# Patient Record
Sex: Female | Born: 1990
Health system: Southern US, Community
[De-identification: ages and names within clinical notes are randomized; demographics above are authoritative.]

## PROBLEM LIST (undated history)

## (undated) DIAGNOSIS — Z789 Other specified health status: Secondary | ICD-10-CM

---
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
## 2011-01-02 LAB — POCT URINALYSIS DIP (DEVICE)
Hgb urine dipstick: NEGATIVE
Hgb urine dipstick: NEGATIVE
Ketones, ur: NEGATIVE mg/dL
Nitrite: NEGATIVE
Nitrite: NEGATIVE
Protein, ur: 30 mg/dL — AB
Protein, ur: NEGATIVE mg/dL
Protein, ur: NEGATIVE mg/dL
Specific Gravity, Urine: 1.015 (ref 1.005–1.030)
Specific Gravity, Urine: 1.015 (ref 1.005–1.030)
Urobilinogen, UA: 0.2 mg/dL (ref 0.0–1.0)
Urobilinogen, UA: 1 mg/dL (ref 0.0–1.0)
Urobilinogen, UA: 0.2 mg/dL (ref 0.0–1.0)
pH: 6 (ref 5.0–8.0)
pH: 6.5 (ref 5.0–8.0)
pH: 6 (ref 5.0–8.0)

## 2011-01-03 LAB — CBC
Hemoglobin: 10.9 g/dL — ABNORMAL LOW (ref 12.0–15.0)
MCV: 90.5 fL (ref 78.0–100.0)
Platelets: 188 10*3/uL (ref 150–400)
Platelets: 230 10*3/uL (ref 150–400)
RDW: 13.6 % (ref 11.5–15.5)
RDW: 13.6 % (ref 11.5–15.5)
WBC: 8.9 10*3/uL (ref 4.0–10.5)

## 2011-01-03 LAB — RPR: RPR Ser Ql: NONREACTIVE

## 2011-05-27 ENCOUNTER — Emergency Department (HOSPITAL_COMMUNITY)
Admission: EM | Admit: 2011-05-27 | Discharge: 2011-05-28 | Disposition: A | Discharge status: Home / Self Care | Payer: Self-pay | Attending: Emergency Medicine | Admitting: Emergency Medicine

## 2011-05-27 DIAGNOSIS — R5381 Other malaise: Secondary | ICD-10-CM | POA: Insufficient documentation

## 2011-05-27 DIAGNOSIS — Z3201 Encounter for pregnancy test, result positive: Secondary | ICD-10-CM | POA: Insufficient documentation

## 2011-05-27 DIAGNOSIS — N390 Urinary tract infection, site not specified: Secondary | ICD-10-CM | POA: Insufficient documentation

## 2011-05-27 DIAGNOSIS — R112 Nausea with vomiting, unspecified: Secondary | ICD-10-CM | POA: Insufficient documentation

## 2011-05-27 DIAGNOSIS — O2390 Unspecified genitourinary tract infection in pregnancy, unspecified trimester: Secondary | ICD-10-CM | POA: Insufficient documentation

## 2011-05-27 LAB — URINALYSIS, ROUTINE W REFLEX MICROSCOPIC
Glucose, UA: NEGATIVE mg/dL
Nitrite: NEGATIVE
Protein, ur: NEGATIVE mg/dL
Urobilinogen, UA: 1 mg/dL (ref 0.0–1.0)

## 2011-05-27 LAB — URINE MICROSCOPIC-ADD ON

## 2011-05-27 LAB — POCT PREGNANCY, URINE: Preg Test, Ur: POSITIVE

## 2011-05-30 LAB — URINE CULTURE: Culture  Setup Time: 201209091125

## 2011-06-19 LAB — URINALYSIS, ROUTINE W REFLEX MICROSCOPIC
Bilirubin Urine: NEGATIVE
Hgb urine dipstick: NEGATIVE
Ketones, ur: 40 — AB
Nitrite: NEGATIVE
Urobilinogen, UA: 1

## 2011-06-19 LAB — URINE CULTURE: Colony Count: >=100000

## 2011-06-19 LAB — URINE MICROSCOPIC-ADD ON

## 2011-06-20 LAB — POCT URINALYSIS DIP (DEVICE)
Bilirubin Urine: NEGATIVE
Glucose, UA: NEGATIVE
Hgb urine dipstick: NEGATIVE
Hgb urine dipstick: NEGATIVE
Nitrite: NEGATIVE
Nitrite: NEGATIVE
Operator id: 297281
Protein, ur: NEGATIVE
Urobilinogen, UA: 0.2
Urobilinogen, UA: 0.2
pH: 7

## 2011-06-23 LAB — POCT URINALYSIS DIP (DEVICE)
Glucose, UA: NEGATIVE mg/dL
Ketones, ur: NEGATIVE mg/dL
Nitrite: NEGATIVE
Nitrite: POSITIVE — AB
Protein, ur: 30 mg/dL — AB
Protein, ur: 30 mg/dL — AB
Specific Gravity, Urine: 1.015 (ref 1.005–1.030)
Urobilinogen, UA: 0.2 mg/dL (ref 0.0–1.0)
Urobilinogen, UA: 1 mg/dL (ref 0.0–1.0)
pH: 7 (ref 5.0–8.0)

## 2011-07-18 ENCOUNTER — Encounter (HOSPITAL_COMMUNITY): Payer: Self-pay

## 2011-07-18 ENCOUNTER — Inpatient Hospital Stay (HOSPITAL_COMMUNITY)
Admission: AD | Admit: 2011-07-18 | Discharge: 2011-07-18 | Disposition: A | Discharge status: Home / Self Care | Payer: Self-pay | Source: Ambulatory Visit | Attending: Obstetrics & Gynecology | Admitting: Obstetrics & Gynecology

## 2011-07-18 DIAGNOSIS — O0930 Supervision of pregnancy with insufficient antenatal care, unspecified trimester: Secondary | ICD-10-CM

## 2011-07-18 DIAGNOSIS — A499 Bacterial infection, unspecified: Secondary | ICD-10-CM | POA: Insufficient documentation

## 2011-07-18 DIAGNOSIS — B9689 Other specified bacterial agents as the cause of diseases classified elsewhere: Secondary | ICD-10-CM | POA: Insufficient documentation

## 2011-07-18 DIAGNOSIS — J069 Acute upper respiratory infection, unspecified: Secondary | ICD-10-CM | POA: Insufficient documentation

## 2011-07-18 DIAGNOSIS — O2390 Unspecified genitourinary tract infection in pregnancy, unspecified trimester: Secondary | ICD-10-CM | POA: Insufficient documentation

## 2011-07-18 DIAGNOSIS — O219 Vomiting of pregnancy, unspecified: Secondary | ICD-10-CM

## 2011-07-18 DIAGNOSIS — R109 Unspecified abdominal pain: Secondary | ICD-10-CM | POA: Insufficient documentation

## 2011-07-18 DIAGNOSIS — N760 Acute vaginitis: Secondary | ICD-10-CM | POA: Insufficient documentation

## 2011-07-18 DIAGNOSIS — O99891 Other specified diseases and conditions complicating pregnancy: Secondary | ICD-10-CM | POA: Insufficient documentation

## 2011-07-18 HISTORY — DX: Other specified health status: Z78.9

## 2011-07-18 LAB — URINALYSIS, ROUTINE W REFLEX MICROSCOPIC
Glucose, UA: NEGATIVE mg/dL
Ketones, ur: 15 mg/dL — AB
Nitrite: NEGATIVE
Protein, ur: NEGATIVE mg/dL
Urobilinogen, UA: 0.2 mg/dL (ref 0.0–1.0)

## 2011-07-18 LAB — WET PREP, GENITAL: Yeast Wet Prep HPF POC: NONE SEEN

## 2011-07-18 LAB — URINE MICROSCOPIC-ADD ON

## 2011-07-18 MED ORDER — GUAIFENESIN-DM 100-10 MG/5ML PO SYRP: 5.0000 mL | ORAL_SOLUTION | Freq: Three times a day (TID) | ORAL | Status: DC | PRN

## 2011-07-18 MED ORDER — METRONIDAZOLE 500 MG PO TABS: 500.0000 mg | ORAL_TABLET | Freq: Three times a day (TID) | ORAL | Status: AC

## 2011-07-18 MED ORDER — METRONIDAZOLE 500 MG PO TABS: 500.0000 mg | ORAL_TABLET | Freq: Three times a day (TID) | ORAL | Status: DC

## 2011-07-18 MED ORDER — PROMETHAZINE HCL 12.5 MG PO TABS: 12.5000 mg | ORAL_TABLET | Freq: Four times a day (QID) | ORAL | Status: DC | PRN

## 2011-07-18 MED ORDER — GUAIFENESIN-DM 100-10 MG/5ML PO SYRP: 5.0000 mL | ORAL_SOLUTION | Freq: Three times a day (TID) | ORAL | Status: AC | PRN

## 2011-07-18 NOTE — Progress Notes (Signed)
Pt states via Spanish interpreter that she has had upper respiratory congestion and cough, non-productive. Denies fever. Has not been around any other persons who were ill. No pnc thus far.

## 2011-07-18 NOTE — Progress Notes (Signed)
Pt presents to [facility] for c/o coughing and cold symptoms that started on Wednesday.  Hasn't taken any meds for this.  The cough is dry and non productive.

## 2011-07-18 NOTE — ED Provider Notes (Signed)
Caitlyn Trinidad-Mondono20 [id number] @[redacted]w[redacted]d  Chief Complaint  Patient presents with  . URI    SUBJECTIVE  HPI: Presents with six-day history of upper respiratory congestion and frequent dry cough. She is begun to have lower abdominal discomfort when she coughs. She's also then vomiting daily throughout the pregnancy and eats only once per day. She last ate solids at 4 PM and is not nauseated presently.  Care at health department with prior pregnancy and plans to initiate care there. Denies contractions, leakage of fluid, vaginal bleeding, vaginal discharge.   Past Medical History  Diagnosis Date  . No pertinent past medical history     Past Surgical History  Procedure Date  . Cesarean section    History   Social History  . Marital Status: Married    Spouse Name: N/A    Number of Children: N/A  . Years of Education: N/A   Occupational History  . Not on file.   Social History Main Topics  . Smoking status: Never Smoker   . Smokeless tobacco: Not on file  . Alcohol Use: No  . Drug Use: No  . Sexually Active:    Other Topics Concern  . Not on file   Social History Narrative  . No narrative on file   No current facility-administered medications on file prior to encounter.   No current outpatient prescriptions on file prior to encounter.   No Known Allergies  ROS: Pertinent items in HPI  OBJECTIVE  BP 112/71  Pulse 84  Temp(Src) 98.4 F (36.9 C) (Oral)  Resp 18  Ht 5' 1.25" (1.556 m)  Wt 61.406 kg (135 lb 6 oz)  BMI 25.37 kg/m2  LMP 03/25/2011   DT FHR normal Fregent dry coughing Physical Exam  Constitutional: She is oriented to person, place, and time and well-developed, well-nourished, and in no distress.  HENT:  Head: Normocephalic.  Mouth/Throat: Oropharynx is clear and moist.  Eyes: Pupils are equal, round, and reactive to light.  Neck: Neck supple.  Cardiovascular: Normal rate, regular rhythm and normal heart sounds.   Pulmonary/Chest: Effort  normal and breath sounds normal. No respiratory distress. She has no wheezes. She has no rales. She exhibits no tenderness.  Abdominal: Soft. She exhibits no distension. There is no tenderness. There is no rebound and no guarding.  Genitourinary: Cervix normal. Vaginal discharge found.       Vag with mod amt mucusy grey discharge. Uterus C/w 16 wk size  Neurological: She is alert and oriented to person, place, and time.  Skin: Skin is warm and dry.  Psychiatric: Affect normal.      Results for orders placed during the hospital encounter of 07/18/11 (from the past 24 hour(s))  URINALYSIS, ROUTINE W REFLEX MICROSCOPIC     Status: Abnormal   Collection Time   07/18/11  6:53 PM      Component Value Range   Color, Urine YELLOW  YELLOW    Appearance CLEAR  CLEAR    Specific Gravity, Urine >1.030 (*) 1.005 - 1.030    pH 6.0  5.0 - 8.0    Glucose, UA NEGATIVE  NEGATIVE (mg/dL)   Hgb urine dipstick TRACE (*) NEGATIVE    Bilirubin Urine NEGATIVE  NEGATIVE    Ketones, ur 15 (*) NEGATIVE (mg/dL)   Protein, ur NEGATIVE  NEGATIVE (mg/dL)   Urobilinogen, UA 0.2  0.0 - 1.0 (mg/dL)   Nitrite NEGATIVE  NEGATIVE    Leukocytes, UA TRACE (*) NEGATIVE   URINE MICROSCOPIC-ADD ON  Status: Abnormal   Collection Time   07/18/11  6:53 PM      Component Value Range   Squamous Epithelial / LPF FEW (*) RARE    WBC, UA 3-6  <3 (WBC/hpf)   RBC / HPF 0-2  <3 (RBC/hpf)   Bacteria, UA FEW (*) RARE    Crystals CA OXALATE CRYSTALS (*) NEGATIVE    Urine-Other MUCOUS PRESENT    WET PREP, GENITAL     Status: Abnormal   Collection Time   07/18/11  8:15 PM      Component Value Range   Yeast, Wet Prep NONE SEEN  NONE SEEN    Trich, Wet Prep NONE SEEN  NONE SEEN    Clue Cells, Wet Prep MODERATE (*) NONE SEEN    WBC, Wet Prep HPF POC FEW (*) NONE SEEN    ASSESSMENT Multip with prev C/S and VBAC with NPC at 16 w 3d URI Abd muscle strain secondary to coughing from post nasal drip BV    PLAN Rx  Flagyl,  Robitussin DM, Cepocal, Phenergan. Advised increased fluids, small frequent meals. Tylenol for pain. Splint abd if coughing vigorously

## 2011-07-18 NOTE — Progress Notes (Signed)
D. Poe, CNM at bedside.  Assessment done and poc discussed with pt.  

## 2011-07-18 NOTE — Progress Notes (Signed)
SSE per CNM. Wet prep and cultures collected.  VE done.  Interpreter at bedside. procedure explained.

## 2011-07-19 LAB — GC/CHLAMYDIA PROBE AMP, GENITAL
Chlamydia, DNA Probe: NEGATIVE
GC Probe Amp, Genital: NEGATIVE

## 2011-09-19 NOTE — L&D Delivery Note (Cosign Needed)
Delivery Note At 3:42 PM a viable female was delivered via Vaginal, Spontaneous Delivery (Presentation: Right Occiput Posterior).  APGAR: 9, 9; weight .   Placenta status: Intact, Spontaneous.  Cord: 3 vessels with the following complications: None.  Cord pH: NA  Anesthesia: Local  Episiotomy: Median Lacerations: 2nd degree perineal and 1st degree periurethral Suture Repair: 3.0 vicryl, and 4.0 vicryl Est. Blood Loss (mL): 300  Mom to postpartum.  Baby to nursery-stable.  Elisea Khader 12/28/2011, 4:23 PM

## 2011-10-25 ENCOUNTER — Ambulatory Visit (INDEPENDENT_AMBULATORY_CARE_PROVIDER_SITE_OTHER): Payer: Self-pay | Admitting: Obstetrics and Gynecology

## 2011-10-25 VITALS — BP 105/67 | Temp 98.1°F | Wt 140.8 lb

## 2011-10-25 DIAGNOSIS — Z3480 Encounter for supervision of other normal pregnancy, unspecified trimester: Secondary | ICD-10-CM | POA: Insufficient documentation

## 2011-10-25 DIAGNOSIS — Z9889 Other specified postprocedural states: Secondary | ICD-10-CM

## 2011-10-25 DIAGNOSIS — Z98891 History of uterine scar from previous surgery: Secondary | ICD-10-CM | POA: Insufficient documentation

## 2011-10-25 DIAGNOSIS — Z23 Encounter for immunization: Secondary | ICD-10-CM

## 2011-10-25 DIAGNOSIS — O0930 Supervision of pregnancy with insufficient antenatal care, unspecified trimester: Secondary | ICD-10-CM

## 2011-10-25 LAB — POCT URINALYSIS DIP (DEVICE)
Bilirubin Urine: NEGATIVE
Glucose, UA: NEGATIVE mg/dL
Hgb urine dipstick: NEGATIVE
Ketones, ur: NEGATIVE mg/dL
Nitrite: NEGATIVE

## 2011-10-25 LAB — CBC
Platelets: 317 10*3/uL (ref 150–400)
RBC: 4.05 MIL/uL (ref 3.87–5.11)
WBC: 7.5 10*3/uL (ref 4.0–10.5)

## 2011-10-25 MED ORDER — INFLUENZA VIRUS VACC SPLIT PF IM SUSP
0.5000 mL | Freq: Once | INTRAMUSCULAR | Status: AC
  Administered 2011-10-25: 0.5 mL via INTRAMUSCULAR

## 2011-10-25 NOTE — Progress Notes (Signed)
   Subjective:    Caitlyn Clark is a [id number] [redacted]w[redacted]d being seen today for her first obstetrical visit.  Her obstetrical history is significant for prior C section. Patient does intend to breast feed. Pregnancy history fully reviewed.  Patient reports no complaints.  Filed Vitals:   10/25/11 1018  BP: 105/67  Temp: 98.1 F (36.7 C)  Weight: 140 lb 12.8 oz (63.866 kg)    HISTORY: OB History    Grav Para Term Preterm Abortions TAB SAB Ect Mult Living   3 2 2  0 0 0 0 0 0 2     # Outc Date GA Lbr Len/2nd Wgt Sex Del Anes PTL Lv   1 TRM 2007 [redacted]w[redacted]d    CCS Spinal     Comments: Delivered in [place] , unsure weight, had problems with breathing in whole pregna   2 TRM 2010 [redacted]w[redacted]d    SVD EPI     Comments: no complications, born [facility]   3 CUR              Past Medical History  Diagnosis Date  . No pertinent past medical history    Past Surgical History  Procedure Date  . Cesarean section    Family History  Problem Relation Age of Onset  . Diabetes Father      Exam    Uterine Size: 28 cm  Pelvic Exam:    Perineum: Normal Perineum   Vulva: normal  System: Breast:  normal appearance, no masses or tenderness   Skin: normal coloration and turgor, no rashes    Neurologic: oriented, grossly non-focal   Extremities: normal strength, tone, and muscle mass   HEENT PERRLA, extra ocular movement intact and sclera clear, anicteric   Mouth/Teeth mucous membranes moist, pharynx normal without lesions   Neck supple   Cardiovascular: regular rate and rhythm   Respiratory:  appears well, vitals normal, no respiratory distress, acyanotic, normal RR, ear and throat exam is normal, neck free of mass or lymphadenopathy, chest clear, no wheezing, crepitations, rhonchi, normal symmetric air entry   Abdomen: normal findings: of a 30 week gravid pt   Urinary: bladder not palpable      Assessment:    Pregnancy: [id number] Patient Active Problem List  Diagnoses  . Supervision of normal  subsequent pregnancy  . Previous cesarean section        Plan:     Initial labs drawn. Prenatal vitamins. Problem list reviewed and updated. Genetic Screening  late prenatal care. Ultrasound discussed; fetal survey: ordered. Follow up in 2 weeks.   D. Piloto Sherron Flemings Paz. MD PGY-1 10/25/2011

## 2011-10-25 NOTE — Progress Notes (Signed)
Addended by: Faythe Casa on: 10/25/2011 12:57 PM   Modules accepted: Orders

## 2011-10-25 NOTE — Progress Notes (Signed)
Addended by: Hillis Range, Lottie Siska on: 10/25/2011 12:07 PM   Modules accepted: Kipp Brood

## 2011-10-25 NOTE — Patient Instructions (Signed)
Embarazo - Tercer trimestre (Pregnancy - Third Trimester) El tercer trimestre del embarazo (los ltimos 3 meses) es el perodo de cambios ms rpidos que atraviesan usted y el beb. El aumento de peso es ms rpido. El beb alcanza un largo de aproximadamente 50 cm (20 pulgadas) y pesa entre 2,700 y 4,500 kg (6 a 10 libras). El beb gana ms tejido graso y ya est listo para la vida fuera del cuerpo de la madre. Mientras estn en el interior, los bebs tienen perodos de sueo y vigilia, succionan el pulgar y tienen hipo. Quizs sienta pequeas contracciones del tero. Este es el falso trabajo de parto. Tambin se las conoce como contracciones de Braxton-Hicks. Es como una prctica del parto. Los problemas ms habituales de esta etapa del embarazo incluyen mayor dificultad para respirar, hinchazn de las manos y los pies por retencin de lquidos y la necesidad de orinar con ms frecuencia debido a que el tero y el beb presionan sobre la vejiga.  EXAMENES PRENATALES  Durante los exmenes prenatales, deber seguir realizando pruebas de sangre, segn avance el embarazo. Estas pruebas se realizan para controlar su salud y la del beb. Tambin se realizan anlisis de sangre para conocer los niveles de hemoglobina. La anemia (bajo nivel de hemoglobina) es frecuente durante el embarazo. Para prevenirla, se administran hierro y vitaminas. Tambin le harn nuevas pruebas para descartar la diabetes. Podrn repetirle algunas de las pruebas que le hicieron previamente.   En cada visita le medirn el tamao del tero. Es para asegurarse de que el beb se desarrolla correctamente.   Tambin en cada visita la pesarn. Esto se realiza para asegurarse de que aumenta de peso al ritmo indicado y que usted y su beb evolucionan normalmente.   En algunas ocasiones se realiza una ecografa para confirmar el correcto desarrollo y evolucin del beb. Esta prueba se realiza con ondas sonoras inofensivas para el beb, de modo  que el profesional pueda calcular con ms precisin la fecha del parto.   Discuta las posibilidades de la anestesia si necesita cesrea.  Algunas veces se realizan pruebas especializadas del lquido amnitico que rodea al beb. Esta prueba se denomina amniocentesis. El lquido amnitico se obtiene introduciendo una aguja en el abdomen (vientre). En ocasiones se lleva a cabo cerca del final del embarazo, si es necesario adelantar el parto. En este caso se realiza para asegurarse de que los pulmones del beb estn lo suficientemente maduros como para que pueda vivir fuera del tero. CAMBIOS QUE OCURREN EN EL TERCER TRIMESTRE DEL EMBARAZO Su organismo atravesar diferentes cambios durante el embarazo que varan de una persona a otra. [place]verse con el profesional que la asiste acerca los cambios que usted note y que la preocupen.  Durante el ltimo trimestre probablemente sienta un aumento del apetito. Es normal tener "antojos" de ciertas comidas. Esto vara de una persona a otra y de un embarazo a otro.   Podrn aparecer las primeras estras en las caderas, abdomen y mamas. Estos son cambios normales del cuerpo durante el embarazo. No existen medicamentos ni ejercicios que puedan prevenir estos cambios.   El estreimiento puede tratarse con un laxante o agregando fibra a su dieta. Beber grandes cantidades de lquidos, tomar fibras en forma de verduras, frutas y granos integrales es de gran ayuda.   Tambin es beneficioso practicar actividad fsica. Si ha sido una persona activa hasta el embarazo, podr continuar con la mayora de las actividades durante el mismo. Si ha sido menos activa, puede ser beneficioso   que comience con un programa de ejercicios, como Pension scheme manager. Consulte con el profesional que la asiste antes de comenzar un programa de ejercicios.   Evite el consumo de cigarrillos, el alcohol, los medicamentos no prescritos y las "drogas de la calle" durante el [place]. Estas sustancias  qumicas afectan la formacin y el desarrollo del beb. Evite estas sustancias durante todo el embarazo para asegurar el nacimiento de un beb sano.   Dolor de espalda, venas varicosas y hemorroides podran aparecer o empeorar.   Los movimientos del beb pueden ser ms bruscos y aparecer ms a menudo.   Puede que note dificultades para respirar facilmente.   El ombligo podra salrsele hacia afuera.   Puede segregar un lquido amarillento (calostro) de las Universal.   Puede segregar mucus con sangre. Esto normalmente ocurre unos pocos das a una semana antes de que comience el [place] de [place].  [place] parte de los cuidados que se aconsejan son los mismos que los indicados para las primeras etapas del Media planner. Es importante que concurra a todas las citas con el profesional y siga sus instrucciones con Engineer, site a los medicamentos que deba Risk manager, a la actividad fsica y a Engineer, technical sales.   Durante el embarazo debe obtener nutrientes para usted y para su beb. Consuma alimentos balanceados a intervalos regulares. Elija alimentos como carne, pescado, [place] y otros productos lcteos descremados, verduras, frutas, panes integrales y cereales. El Conservation officer, nature cul es el aumento de peso ideal.   Las relaciones sexuales pueden continuarse hasta casi el final del embarazo, si no se presentan otros problemas como prdida prematura (antes de tiempo) de lquido amnitico, hemorragia vaginal o dolor abdominal (en el vientre).   [place], si no tiene restricciones. Consulte con el profesional que la asiste si no sabe con certeza si determinados ejercicios son seguros. El mayor aumento de peso se produce [place] ltimos trimestres del [place].   Haga reposo con frecuencia, con las piernas elevadas, o segn lo necesite para evitar los calambres y el dolor de cintura.   Use un buen sostn o como los que se usan para hacer  deportes para Public house manager la sensibilidad de las [place]. Tambin puede serle til si lo [place] mientras duerme. Si pierde Adult nurse, podr [employer].   No utilice la baera con agua caliente, baos turcos y saunas.   Colquese el cinturn de seguridad cuando conduzca. Este la proteger a usted y al beb en caso de accidente.   Evite comer carne cruda y el contacto con los utensilios y desperdicios de los gatos. Estos elementos contienen grmenes que pueden causar defectos de nacimiento en el beb.   Es fcil perder algo de orina durante el [place]. Apretar y Software engineer los msculos de la pelvis la ayudar con este problema. Practique detener la miccin cuando est en el bao. Estos son los mismos msculos que Advertising copywriter. Son [employer] mismos msculos que utiliza cuando trata de [employer] gases. Puede practicar apretando estos msculos [employer], y repetir esto tres veces por da aproximadamente. Una vez que conozca qu msculos debe contraer, no realice estos ejercicios durante la miccin. Puede favorecerle una infeccin si la orina vuelve hacia atrs.   Pida ayuda si tiene necesidades econmicas, de asesoramiento o nutricionales durante el [place]. El profesional podr ayudarla con respecto a estas necesidades, o derivarla a otros especialistas.   Practique la ida [employer] hospital a modo  de prueba.   Tome clases prenatales junto con su pareja para comprender, practicar y hacer preguntas acerca del [place] de parto y el nacimiento.   Prepare la habitacin del beb.   No viaje fuera de la ciudad a menos que sea absolutamente necesario y con el consejo del mdico.   Use slo zapatos bajos sin taco para tener un mejor equilibrio y prevenir cadas.  EL CONSUMO DE MEDICAMENTOS Y DROGAS DURANTE EL EMBARAZO  Contine tomando las vitaminas apropiadas para esta etapa tal como se le indic. Las vitaminas deben contener un miligramo de cido flico y deben suplementarse con  hierro. Guarde todas las vitaminas fuera del alcance de los nios. La ingestin de slo un par de vitaminas o comprimidos que contengan hierro pueden ocasionar la [employer] en un beb o en un nio pequeo.   Evite el uso de [place], inclusive los de venta [place], que no hayan sido prescritos o indicados por el profesional que la asiste. Algunos medicamentos pueden causar problemas fsicos al beb. Utilice los medicamentos de venta libre o de prescripcin para Conservation officer, historic buildings, Health and safety inspector o la [place], segn se lo indique el profesional que lo asiste. No utilice aspirina, ibuprofeno (Motrin, Advil, Nuprin) o naproxeno (Aleve) a menos que el profesional la autorice.   El alcohol se asocia a cierto nmero de defectos del nacimiento, incluido el sndrome de alcoholismo fetal. Debe evitar el consumo de alcohol en cualquiera de sus formas. El cigarrillo causa nacimientos prematuros y bebs de bajo peso al nacer. Las drogas de la calle son muy nocivas para el beb y estn absolutamente prohibidas. Un beb que nace de [employer], ser adicto al nacer. Ese beb tendr los mismos sntomas de abstinencia que un adulto.   Infrmele al profesional si consume alguna droga.  SOLICITE ATENCIN MDICA SI: Tiene alguna preocupacin Solicitor. Es mejor que llame para formular las preguntas si no puede esperar hasta la prxima visita, que sentirse preocupada por ellas.  [place] o no cul es el sexo de su beb. Si es un varn, ste es el momento de pensar acerca de la circuncisin. La circuncisin es la extirpacin del prepucio. Esta es la piel que cubre el extremo sensible del pene. No hay un motivo mdico que lo justifique. Generalmente la decisin se toma segn lo que sea popular en ese momento, o se basa en creencias religiosas. Podr conversar estos temas con el profesional que la asiste. SOLICITE ATENCIN MDICA DE INMEDIATO SI:  La temperatura oral se eleva  sin motivo por encima de 5 F (38.9 C) o segn le indique el profesional que la asiste.   Tiene una prdida de lquido por la vagina (canal de parto). Si sospecha una ruptura de las [place], tmese la temperatura y llame al profesional para informarlo sobre esto.   Observa unas pequeas manchas, una hemorragia vaginal o elimina cogulos. Avsele al profesional acerca de la cantidad y de cuntos apsitos est utilizando.   Presenta un olor desagradable en la secrecin vaginal y observa un cambio en el color, de transparente a blanco.   Ha vomitado durante ms de 24 horas.   Presenta escalofros o fiebre.   Comienza a sentir falta de [place].   Siente ardor al [place].   [place] o sube ms de 900 g (ms de 2 libras), o segn lo indicado por el profesional que la asiste. Observa que sbitamente se le [employer], las manos, los pies o las  piernas.   Presenta dolor abdominal. Las molestias en el ligamento redondo son Neomia Dear causa benigna (no cancerosa) frecuente de Engineer, mining abdominal durante el Psychiatrist, pero el profesional que la asiste deber evaluarlo.   Presenta dolor de cabeza intenso que no se [place].   Si no siente los movimientos del beb durante ms de tres horas. Si piensa que el beb no se mueve tanto como lo haca habitualmente, coma algo que Psychologist, clinical y [employer] Corporation lado izquierdo durante [place]. El beb debe moverse al menos 4  5 veces por hora. Comunquese inmediatamente si el beb se mueve menos que lo indicado.   Se cae, se ve involucrada en un accidente automovilstico o sufre algn tipo de traumatismo.   En su hogar hay violencia mental o fsica.  Document Released: 06/14/2005 Document Revised: 05/17/2011 [facility] Patient Information 2012 [place], [place].Embarazo - Segundo trimestre (Pregnancy - Second Trimester) El segundo trimestre del Psychiatrist (del 3 al ) es un perodo de evolucin rpida para usted y el beb. Hacia el final del sexto mes, el beb  mide aproximadamente 23 cm y pesa 680 g. Comenzar a Pharmacologist del beb [place] 18 y las 20 [address] de [place]. Podr sentir las pataditas ("quickening en ingls"). Hay un rpido [employer]. Puede segregar un lquido claro Charity fundraiser) de las [place]. Quizs sienta pequeas contracciones en el vientre (tero) Esto se conoce como falso trabajo de parto o contracciones de Braxton-Hicks. Es como una prctica del trabajo de parto que se produce cuando el beb est listo para salir. Generalmente los problemas de vmitos matinales ya se han superado hacia el final del Medical sales representative. Algunas mujeres desarrollan pequeas manchas oscuras (que se denominan cloasma, mscara del embarazo) en la cara que normalmente se van luego del nacimiento del beb. La exposicin al sol empeora las manchas. Puede desarrollarse acn en algunas mujeres embarazadas, y puede desaparecer en aquellas que ya tienen acn. EXAMENES PRENATALES  Durante los [employer], deber seguir realizando pruebas de [place], segn avance el [place]. Estas pruebas se realizan para controlar su salud y la del beb. Tambin se realizan anlisis de sangre para The [employer] niveles de [place]. La anemia (bajo nivel de hemoglobina) es frecuente durante el embarazo. Para prevenirla, se administran hierro y vitaminas. Tambin se le realizarn exmenes para saber si tiene diabetes entre las 24 y las 28 semanas del [place]. Podrn repetirle algunas de las Hovnan[employer] Enterprises hicieron previamente.   En cada visita le medirn el tamao del tero. Esto se realiza para asegurarse de que el beb est creciendo correctamente de acuerdo al estado del [place].   Tambin en cada visita prenatal controlarn su presin arterial. Esto se realiza para asegurarse de que no tenga toxemia.   Se controlar su orina para asegurarse de que no tenga infecciones, diabetes o protena en la orina.   Se controlar su peso regularmente para asegurarse que el  aumento ocurre al ritmo indicado. Esto se hace para asegurarse que usted y el beb tienen una evolucin normal.   En algunas ocasiones se realiza una prueba de ultrasonido para confirmar el correcto desarrollo y evolucin del beb. Esta prueba se realiza con ondas sonoras inofensivas para el beb, de modo que el profesional pueda calcular ms precisamente la fecha del [place].  Algunas veces se realizan pruebas especializadas del lquido amnitico que rodea al beb. Esta prueba se denomina amniocentesis. El lquido amnitico se obtiene introduciendo una aguja en el vientre (abdomen). Se realiza para [employer] Corporation  cromosomas en aquellos casos en los que existe alguna preocupacin acerca de algn problema gentico que pueda sufrir el beb. En ocasiones se lleva a cabo cerca del final del embarazo, si es necesario inducir al Apple Computer. En este caso se realiza para asegurarse que los pulmones del beb estn lo suficientemente maduros como para que pueda vivir fuera del tero. CAMBIOS QUE OCURREN EN EL SEGUNDO TRIMESTRE DEL EMBARAZO Su organismo atravesar numerosos cambios durante el [employer] Lots. Estos pueden variar de Neomia Dear persona a otra. Converse con el profesional que la asiste acerca los cambios que usted note y que la preocupen.  Durante el segundo trimestre probablemente sienta un aumento del apetito. Es normal tener "antojos" de Development worker, community. Esto vara de Neomia Dear persona a otra y de un embarazo a Therapist, art.   El abdomen inferior comenzar a abultarse.   Podr tener la necesidad de Geographical information systems officer con ms frecuencia debido a que el tero y el beb presionan sobre la vejiga. Tambin es frecuente contraer ms infecciones urinarias durante el embarazo (dolor al ConocoPhillips). Puede evitarlas bebiendo gran cantidad de lquidos y vaciando la vejiga antes y despus de Sales promotion account executive.   Podrn aparecer las primeras estras en las caderas, abdomen y [place]. Estos son cambios normales del cuerpo durante el [place]. No existen  medicamentos ni ejercicios que puedan prevenir [employer]Max.   Es posible que comience a desarrollar venas inflamadas y abultadas (varices) en las piernas. El uso de medias de descanso, Optometrist sus pies durante 15 minutos, 3 a 4 veces al da y Film/video editor la sal en su dieta ayuda a Journalist, newspaper.   Podr sentir Engineering geologist gstrica a medida que el tero crece y Doctor, general practice. Puede tomar anticidos, con la autorizacin de su mdico, para Financial planner. Tambin es til ingerir pequeas comidas 4 a 5 veces al Futures trader.   La constipacin puede tratarse con un laxante o agregando fibra a su dieta. Beber grandes cantidades de lquidos, comer vegetales, frutas y granos integrales es de [place].   Tambin es beneficioso practicar actividad fsica. Si ha sido una persona Engineer, mining, podr continuar con la Harley[employer]son de las actividades durante el mismo. Si ha sido [employer], puede ser beneficioso que comience con un programa de ejercicios, Museum/gallery exhibitions officer.   Puede desarrollar hemorroides (vrices en el recto) hacia el final del segundo trimestre. Tomar baos de asiento tibios y Chemical engineer cremas recomendadas por el profesional que lo asiste sern de ayuda para los problemas de hemorroides.   Tambin podr Financial risk analyst de espalda durante este momento de su embarazo. Evite levantar objetos pesados, utilice zapatos de taco bajo y [place] buena postura para ayudar a reducir los problemas de [place].   Algunas mujeres embarazadas desarrollan hormigueo y adormecimiento de la mano y los dedos debido a la hinchazn y compresin de los ligamentos de la mueca (sndrome del tnel carpiano). Esto desaparece una vez que el beb nace.   Como sus pechos se agrandan, Pension scheme manager un sujetador ms grande. Use un sostn de soporte, cmodo y de algodn. No utilice un sostn para amamantar hasta el ltimo mes de embarazo si va a amamantar al beb.   Podr observar una lnea oscura desde  el ombligo hacia la zona pbica denominada linea nigra.   Podr observar que sus mejillas se ponen coloradas debido al aumento de flujo sanguneo en la cara.   Podr desarrollar "araitas" en la cara, cuello y pecho. Esto desaparece una vez que el beb  nace.  INSTRUCCIONES PARA EL CUIDADO DOMICILIARIO  Es extremadamente importante que evite el cigarrillo, hierbas medicinales, alcohol y las drogas no prescriptas durante el Psychiatrist. Estas sustancias qumicas afectan la formacin y el desarrollo del beb. Evite estas sustancias durante todo el embarazo para asegurar el nacimiento de un beb sano.   La mayor parte de los cuidados que se aconsejan son los mismos que los indicados para Financial risk analyst trimestre del Psychiatrist. Cumpla con las citas tal como se le indic. Siga las instrucciones del profesional que lo asiste con respecto al uso de los medicamentos, el ejercicio y Psychologist, forensic.   Durante el embarazo debe obtener nutrientes para usted y para su beb. Consuma alimentos balanceados a intervalos regulares. Elija alimentos como carne, pescado, [place] y otros productos lcteos descremados, vegetales, frutas, panes integrales y cereales. El Equities trader cul es el aumento de peso ideal.   Las relaciones sexuales fsicas pueden continuarse hasta cerca del fin del embarazo si no existen otros problemas. Estos problemas pueden ser la prdida temprana (prematura) de lquido amnitico de las [place], sangrado vaginal, dolor abdominal u otros problemas mdicos o del Psychiatrist.   Realice Tesoro Corporation, si no tiene restricciones. Consulte con el profesional que la asiste si no sabe con certeza si determinados ejercicios son seguros. El mayor aumento de peso tiene Environmental consultant durante los ltimos 2 trimestres del Psychiatrist. El ejercicio la ayudar a:   Engineering geologist.   Ponerla en forma para el parto.   Ayudarla a perder peso luego de haber dado a luz.   Use un buen sostn o como los que  se usan para hacer deportes para Paramedic la sensibilidad de las [place]. Tambin puede serle til si lo [place] mientras duerme. Si pierde Product manager, podr [employer] Hann[employer]in.   No utilice la baera con agua caliente, baos turcos y saunas durante el [address]   Utilice el cinturn de seguridad sin excepcin cuando conduzca. Este la proteger a usted y al beb en caso de accidente.   Evite comer carne cruda, queso crudo, y el contacto con los utensilios y desperdicios de los gatos. Estos elementos contienen grmenes que pueden causar defectos de nacimiento en el beb.   El segundo trimestre es un buen momento para visitar a su dentista y Software engineer si an no lo ha hecho. Es Primary school teacher los dientes limpios. Utilice un cepillo de dientes blando. Cepllese ms suavemente durante el embarazo.   Es ms fcil perder algo de orina durante el [place]. Apretar y Chief Operating Officer los msculos de la pelvis la ayudar con este problema. Practique detener la miccin cuando est en el bao. Estos son los mismos msculos que Development worker, international aid. Son [employer] Partners mismos msculos que utiliza cuando trata de [employer] gases. Puede practicar apretando estos msculos 10 veces, y repetir esto 3 veces por da aproximadamente. Una vez que conozca qu msculos debe apretar, no realice estos ejercicios durante la miccin. Puede favorecerle una infeccin si la orina vuelve hacia atrs.   Pida ayuda si tiene necesidades econmicas, de asesoramiento o nutricionales durante el [place]. El profesional podr ayudarla con respecto a estas necesidades, o derivarla a otros especialistas.   La piel puede ponerse grasa. Si esto sucede, lvese la cara con un jabn [place], utilice un humectante no graso y [place] con base de aceite o crema.  CONSUMO DE MEDICAMENTOS Y DROGAS DURANTE EL EMBARAZO  Contine tomando las vitaminas apropiadas para esta etapa tal como se le  indic. Las vitaminas deben contener un  miligramo de cido flico y deben suplementarse con hierro. Guarde todas las vitaminas fuera del alcance de los nios. La ingestin de slo un par de vitaminas o tabletas que contengan hierro puede ocasionar la [employer] Mining en un beb o en un nio pequeo.   Evite el uso de [place], inclusive los de venta libre y hierbas que no hayan sido prescriptos o indicados por el profesional que la asiste. Algunos medicamentos pueden causar problemas fsicos al beb. Utilice los medicamentos de venta libre o de prescripcin para Chief Technology Officer, Environmental health practitioner o la [place], segn se lo indique el profesional que lo asiste. No utilice aspirina.   El consumo de alcohol est relacionado con ciertos defectos de nacimiento. Esto incluye el sndrome de alcoholismo fetal. Debe evitar el consumo de alcohol en cualquiera de sus formas. El cigarrillo causa nacimientos prematuros y bebs de [place]. El uso de drogas recreativas est absolutamente prohibido. Son muy nocivas para el beb. Un beb que nace de [employer] Express, ser adicto al nacer. Ese beb tendr los mismos sntomas de abstinencia que un adulto.   Infrmele al profesional si consume alguna droga.   No consuma drogas ilegales. Pueden causarle mucho dao al beb.  SOLICITE ATENCIN MDICA SI: Tiene preguntas o preocupaciones durante su embarazo. Es mejor que llame para Science writer las dudas que esperar hasta su prxima visita prenatal. Thressa Sheller forma se sentir ms tranquila.  SOLICITE ATENCIN MDICA DE INMEDIATO SI:  La temperatura oral se eleva sin motivo por encima de 102 F (38.9 C) o segn le indique el profesional que lo asiste.   Tiene una prdida de lquido por la vagina (canal de parto). Si sospecha una ruptura de las [place], tmese la temperatura y llame al profesional para informarlo sobre esto.   Observa unas pequeas manchas, una hemorragia vaginal o elimina cogulos. Notifique al profesional acerca de la cantidad y de cuntos apsitos est utilizando.  Unas pequeas manchas de sangre son algo comn durante el Psychiatrist, especialmente despus de Sales promotion account executive.   Presenta un olor desagradable en la secrecin vaginal y observa un cambio en el color, de transparente a blanco.   Contina con las nuseas y no obtiene alivio de los remedios indicados. Vomita sangre o algo similar a la borra del caf.   [place] o sube ms de 900 g. en una semana, o segn lo indicado por el profesional que la asiste.   Observa que se le [employer], las manos, los pies o las piernas.   Ha estado expuesta a la rubola y no ha sufrido la enfermedad.   Ha estado expuesta a la quinta enfermedad o a la varicela.   Presenta dolor abdominal. Las molestias en el ligamento redondo son Neomia Dear causa no cancerosa (benigna) frecuente de dolor abdominal durante el embarazo. El profesional que la asiste deber evaluarla.   Presenta dolor de cabeza intenso que no se [place].   Presenta fiebre, diarrea, dolor al orinar o le falta la respiracin.   Presenta dificultad para ver, visin borrosa, o visin doble.   Sufre una cada, un accidente de trnsito o cualquier tipo de trauma.   Vive en un hogar en el que existe violencia fsica o mental.  Document Released: 06/14/2005 Document Revised: 05/17/2011 [facility] Patient Information 2012 [place], [place].

## 2011-10-25 NOTE — Progress Notes (Signed)
Saw pt with resident and agree. Reviewed history and updated PL. Plans TOLAC: consent form given. Scheduled anat [place]. Glucola, CBC, RPR done today. GC/CT neg in Oct.

## 2011-10-25 NOTE — Progress Notes (Signed)
P=93, Used [employer]#[id number], states sometimes doesn't feel baby move as much as first baby did , feels movement  more at night sometimes, c/o pelvic pressure , Given new patient booklet, desires influenza vaccine today, discussed appropriate weight gain- patient has lost weight- states was having morning sickness, but states is better now.

## 2011-10-26 LAB — RPR

## 2011-10-27 ENCOUNTER — Ambulatory Visit (HOSPITAL_COMMUNITY)
Admission: RE | Admit: 2011-10-27 | Discharge: 2011-10-27 | Disposition: A | Discharge status: Home / Self Care | Payer: Self-pay | Source: Ambulatory Visit | Attending: Obstetrics and Gynecology | Admitting: Obstetrics and Gynecology

## 2011-10-27 DIAGNOSIS — Z3689 Encounter for other specified antenatal screening: Secondary | ICD-10-CM | POA: Insufficient documentation

## 2011-10-27 DIAGNOSIS — O0930 Supervision of pregnancy with insufficient antenatal care, unspecified trimester: Secondary | ICD-10-CM | POA: Insufficient documentation

## 2011-10-27 DIAGNOSIS — Z3480 Encounter for supervision of other normal pregnancy, unspecified trimester: Secondary | ICD-10-CM

## 2011-10-27 DIAGNOSIS — Z98891 History of uterine scar from previous surgery: Secondary | ICD-10-CM

## 2011-11-15 ENCOUNTER — Ambulatory Visit (INDEPENDENT_AMBULATORY_CARE_PROVIDER_SITE_OTHER): Payer: Self-pay | Admitting: Obstetrics and Gynecology

## 2011-11-15 ENCOUNTER — Encounter: Payer: Self-pay | Admitting: Obstetrics and Gynecology

## 2011-11-15 DIAGNOSIS — Z3480 Encounter for supervision of other normal pregnancy, unspecified trimester: Secondary | ICD-10-CM

## 2011-11-15 DIAGNOSIS — Z98891 History of uterine scar from previous surgery: Secondary | ICD-10-CM

## 2011-11-15 DIAGNOSIS — O34219 Maternal care for unspecified type scar from previous cesarean delivery: Secondary | ICD-10-CM

## 2011-11-15 LAB — POCT URINALYSIS DIP (DEVICE)
Bilirubin Urine: NEGATIVE
Ketones, ur: NEGATIVE mg/dL

## 2011-11-15 NOTE — Progress Notes (Signed)
Plans TOLAC, hx VBAC. Doing well.

## 2011-11-15 NOTE — Patient Instructions (Signed)
Amamantar al beb (Breastfeeding) LOS BENEFICIOS DE AMAMANTAR Para el beb  La primera leche (calostro ) ayuda al mejor funcionamiento del sistema digestivo del beb.   La leche tiene anticuerpos que provienen de la madre y que ayudan a prevenir las infecciones en el beb.   Hay una menor incidencia de asma, enfermedades alrgicas y SMSI (sndrome de muerte sbita nfantil).   Los nutrientes que contiene la leche materna son mejores que las frmulas para el bibern y favorecen el desarrollo cerebral.   Los bebs amamantados sufren menos gases, clicos y constipacin.  Para la mam  La lactancia materna favorece el desarrollo de un vnculo muy especial entre la madre y el beb.   Es ms conveniente, siempre disponible a la temperatura adecuada y ms econmica que la leche maternizada.   Consume caloras en la madre y la ayuda a perder el peso ganado durante el embarazo.   Favorece la contraccin del tero a su tamao normal, de manera ms rpida y disminuye las hemorragias luego del parto.   Las madres que amamantan tienen menor riesgo de desarrollar cncer de mama.  AMAMNTELO CON FRECUENCIA  Un beb sano, nacido a trmino, puede amamantarse con tanta frecuencia como cada hora, o espaciar las comidas cada tres horas.   Esta frecuencia variar de un beb a otro. Observe al beb cuando manifieste signos de hambre, antes que regirse por el reloj.   Amamntelo tan seguido como el beb lo solicite, o cuando usted sienta la necesidad de aliviar sus mamas.   Despierte al beb si han pasado 3  4 horas desde la ltima comida.   El amamantamiento frecuente la ayudar a producir ms leche y a prevenir problemas de dolor en los pezones e hinchazn de las mamas.  LA POSICIN DEL BEB PARA AMAMANTARLO  Ya sea que se encuentre acostada o sentada, asegrese que el abdomen del beb enfrente el suyo.   Sostenga la mama con el pulgar por arriba y el resto de los dedos por debajo. Asegrese que  sus dedos se encuentren lejos del pezn y de la boca del beb.   Toque suavemente los labios del beb y la mejilla ms cercana a la mama con el dedo o el pezn.   Cuando la boca del beb se abra lo suficiente, introduzca el pezn y la zona oscura que lo rodea tanto como le sea posible dentro de la boca.   Coloque a beb cerca suyo de modo que su nariz y mejillas toquen las mamas al mamar.  LAS COMIDAS  La duracin de cada comida vara de un beb a otro y de una comida a otra.   El beb debe succionar alrededor de dos o tres minutos para que le llegue leche. Esto se denomina "bajada". Por este motivo, permita que el nio se alimente en cada mama todo lo que desee. Terminar de mamar cuando haya recibido la cantidad adecuada de nutrientes.   Para detener la succin coloque su dedo en la comisura de la boca del nio y deslcelo entre sus encas antes de quitarle la mama de la boca. Esto la ayudar a evitar el dolor en los pezones.  REDUCIR LA CONGESTIN DE LAS MAMAS  Durante la primera semana despus del parto, usted puede experimentar congestin en las mamas. Cuando las mamas estn congestionadas, se sienten calientes, llenas y molestas al tacto. Puede reducir la congestin si:   Lo amamanta frecuentemente, cada 2-3 horas. Las mams que amamantan pronto y con frecuencia tienen menos problemas   de congestin.   Coloque bolsas fras livianas entre cada mamada. Esto ayuda a reducir la hinchazn. Envuelva las bolsas de hielo en una toalla liviana para proteger su piel.   Aplique compresas hmedas calientes sobre la mama durante 5 a 10 minutos antes de amamantar al nio. Esto aumenta la circulacin y ayuda a que la leche fluya.   Masajee suavemente la mama antes y durante la alimentacin.   Asegrese que el nio vaca al menos una mama antes de cambiar de lado.   Use un sacaleche para vaciar la mama si el beb se duerme o no se alimenta bien. Tambin podr quitarse la leche con esta bomba si  tiene que volver al trabajo o siente que las mamas estn congestionadas.   Evite los biberones, chupetes o complementar la alimentacin con agua o jugos en lugar de la leche materna.   Verifique que el beb se encuentra en la posicin correcta mientras lo alimenta.   Evite el cansancio, el estrs y la anemia   Use un soutien que sostenga bien sus mamas y evite los que tienen aro.   Consuma una dieta balanceada y beba lquidos en cantidad.  Si sigue estas indicaciones, la congestin debe mejorar en 24 a 48 horas. Si an tiene dificultades, consulte a su asesor en lactancia. TENDR SUFICIENTE LECHE MI BEB? Algunas veces las madres se preocupan acerca de si sus bebs tendrn la leche suficiente. Puede asegurarse que el beb tiene la leche suficiente si:  El beb succiona y escucha que traga activamente.   El nio se alimenta al menos 8 a 12 veces en 24 horas. Alimntelo hasta que se desprenda por sus propios medios o se quede dormido en la primera mama (al menos durante 10 a 20 minutos), luego ofrzcale el otro lado.   El beb moja 5 a 6 paales descartables (6 a 8 paales de tela) en 24 horas cuando tiene 5  6 das de vida.   Tiene al menos 2-3 deposiciones todos los das en los primeros meses. La leche materna es todo el alimento que el beb necesita. No es necesario que el nio ingiera agua o preparados de bibern. De hecho, para ayudar a que sus mamas produzcan ms leche, lo mejor es no darle al beb suplementos durante las primeras semanas.   La materia fecal debe ser blanda y amarillenta.   El beb debe aumentar 112 a 196 g por semana.  CUDESE Cuide sus mamas del siguiente modo:  Bese o dchese diariamente.   No lave sus pezones con jabn.   Comience a amamantar del lado izquierdo en una comida y del lado derecho en la siguiente.   Notar que aumenta el suministro de leche a los 2 a 5 das despus del parto. Puede sentir algunas molestias por la congestin, lo que hace que  sus mamas estn duras y sensibles. La congestin disminuye en 24 a 48 horas. Mientras tanto, aplique toallas hmedas calientes durante 5 a 10 minutos antes de amamantar. Un masaje suave y la extraccin de un poco de leche antes de amamantar ablandarn las mamas y har ms fcil que el beb se agarre. Use un buen sostn y seque al aire los pezones durante 10 a 15 minutos luego de cada alimentacin.   Solo utilice apsitos de algodn.   Utilice lanolina pura sobre los pezones luego de amamantar. No necesita lavarlos luego de alimentar al nio.  Cudese del siguiente modo:   Consuma alimentos bien balanceados y refrigerios nutritivos.     Beba leche, jugos de fruta y agua para satisfacer la sed (alrededor de 8 vasos por da).   Descanse lo suficiente.   Aumente la ingesta de calcio en la dieta (1200mg/da).   Evite los alimentos que usted nota que puedan afectar al beb.  SOLICITE ATENCIN MDICA SI:  Tiene preguntas que formular o dificultades con la alimentacin a pecho.   Necesita ayuda.   Observa una zona dura, roja y que le duele en la zona de la mama, y se acompaa de fiebre de 100.5 F (38.1 C) o ms.   El beb est muy somnoliento como para alimentarse bien o tiene problemas para dormir.   El beb moja menos de 6 paales por da, a partir de los 5 das de vida.   La piel del beb o la parte blanca de sus ojos est ms amarilla de lo que estaba en el hospital.   Se siente deprimida.  Document Released: 09/04/2005 Document Revised: 05/17/2011 [employer] Patient Information 2012 [employer]. 

## 2011-11-15 NOTE — Progress Notes (Signed)
Pulse 70 

## 2011-11-29 ENCOUNTER — Ambulatory Visit (INDEPENDENT_AMBULATORY_CARE_PROVIDER_SITE_OTHER): Payer: Self-pay | Admitting: Family Medicine

## 2011-11-29 VITALS — BP 106/69 | Temp 98.0°F | Wt 144.5 lb

## 2011-11-29 DIAGNOSIS — Z3480 Encounter for supervision of other normal pregnancy, unspecified trimester: Secondary | ICD-10-CM

## 2011-11-29 DIAGNOSIS — Z23 Encounter for immunization: Secondary | ICD-10-CM

## 2011-11-29 DIAGNOSIS — O34219 Maternal care for unspecified type scar from previous cesarean delivery: Secondary | ICD-10-CM

## 2011-11-29 LAB — POCT URINALYSIS DIP (DEVICE)
Bilirubin Urine: NEGATIVE
Glucose, UA: NEGATIVE mg/dL
Ketones, ur: NEGATIVE mg/dL
Specific Gravity, Urine: 1.025 (ref 1.005–1.030)

## 2011-11-29 LAB — GC/CHLAMYDIA PROBE AMP, GENITAL: Chlamydia: NEGATIVE

## 2011-11-29 LAB — STREP B DNA PROBE: GBS: NEGATIVE

## 2011-11-29 MED ORDER — TETANUS-DIPHTH-ACELL PERTUSSIS 5-2.5-18.5 LF-MCG/0.5 IM SUSP
0.5000 mL | Freq: Once | INTRAMUSCULAR | Status: AC
  Administered 2011-11-29: 0.5 mL via INTRAMUSCULAR

## 2011-11-29 MED ORDER — INFLUENZA VIRUS VACC SPLIT PF IM SUSP: 0.5000 mL | Freq: Once | INTRAMUSCULAR | Status: DC

## 2011-11-29 NOTE — Progress Notes (Signed)
S: Doing well with no concerns.  No vaginal bleeding or discharge.  No dysuria.  Reports good fetal movement. Is having contractions every 1-2 hours for the last 2 days.  O: vitals reviewed FH 33cm FHT 57  A/P: 21 year old G3P2002 at [redacted]w[redacted]d -GBS, pap, GC/Chlamydia collected -urine sent for culture -Tdap given -continue prenatal  -labor precautions reviewed

## 2011-11-29 NOTE — Progress Notes (Signed)
P=75, c/o abdominal /pelvic pressure since Saturday, c/o contractions every 30 minutes to an hour since Sunday morning, used [employer]pre[employer], desires TDAP today

## 2011-11-29 NOTE — Patient Instructions (Signed)
Everything is going very well.  We collected some labs today, we will go over the results at your visit next week.  Contracciones de Designer, multimedia ([employer]) Usted presenta un falso trabajo de [place]. Durante todo el embarazo aparecen con frecuencia contracciones del tero. Hacia el final del embarazo (32-34 semanas) estas contracciones (Braxton Hicks) pueden hacerse ms fuertes. No se trata de un trabajo de parto verdadero porque no producen un agrandamiento (dilatacin) y afinamiento del cuello del tero. Algunas veces resulta difcil distinguirlas del trabajo de parto verdadero porque en algunos casos llegan a ser muy intensas y las personas tienen distinta tolerancia al [employer]. No debe sentirse avergonzada si ingresa al hospital con un falso trabajo de [place]. En ocasiones la nica forma de saber si est en un parto verdadero es observar los cambios en el cuello del tero. A veces, la nica forma de saber si realmente est en trabajo de parto es para el mdico observar los cambios en el tero. Como diferenciar el [place] de parto falso del verdadero:  Aggie Cosier de parto falso.   Las contracciones falsas generalmente duran menos y no son tan intensas como las verdaderas.   Generalmente se sienten en la zona inferior del abdomen y en la ingle.   Pueden aliviarse con una caminata o cambiar de posicin mientras se est acostada.   A medida que pasa el tiempo son ms cortas y dbiles.   Generalmente son irregulares.   No se hacen progresivamente ms intensas y Her[employer]ist entre s [employer].   Trabajo de parto verdadero.   Las contracciones verdaderas duran de 30 a 70 segundos, son ms regulares, generalmente se hacen ms intensas y Comptroller.   No desaparecen al caminar.   La molestia generalmente se siente en la parte superior del tero y se extiende hacia la zona inferior del abdomen y [employer] cintura.   El profesional que la asiste podr examinarla  para determinar si el trabajo de parto es verdadero. El examen mostrar si el cuello del tero se est dilatando y [place].  Si no hay problemas prenatales u otras complicaciones de la salud asociadas al embarazo, no habr inconvenientes si la envan a su casa y espera el comienzo del verdadero trabajo de [place]. INSTRUCCIONES PARA EL CUIDADO DOMICILIARIO  Siga con los ejercicios y las indicaciones habituales.   Tome los medicamentos como se le indic.   Cumpla con las citas regularmente.   Coma y beba ligero si cree que dar a luz.   Si se siente incmoda por las contracciones:   [place] de [place], si est acostada o en reposo, camine y si est caminando, repose.   Sintense y repose en una baadera con agua caliente.   Beba entre 2 y 3 vasos de [place]. La deshidratacin puede causar contracciones BH.   Respire lenta y profundamente varias veces por hora.  SOLICITE ATENCIN MDICA DE INMEDIATO SI:  Las contracciones se intensifican, se hacen ms regulares y [employer] s.   Tiene una prdida importante de lquido de la vagina   La temperatura oral se eleva sin motivo por encima de 102 F (38.9 C) o segn le indique el profesional que la asiste.   Elimina una mucosidad sanguinolenta.   Presenta hemorragia vaginal.   Presenta dolor abdominal constante.   Siente un dolor en la parte baja de la espalda que nunca haba sentido antes.   Siente que el beb empuja hacia abajo y le causa presin plvica.  El beb no se mueve tanto como antes.  Document Released: 06/14/2005 Document Revised: 08/24/2011 [facility] Patient Information 2012 [place], [place].

## 2011-11-30 NOTE — Progress Notes (Signed)
Chart review done.  Agree with resident note.   

## 2011-12-02 LAB — CULTURE, BETA STREP (GROUP B ONLY)

## 2011-12-03 LAB — CULTURE, OB URINE

## 2011-12-13 ENCOUNTER — Ambulatory Visit (INDEPENDENT_AMBULATORY_CARE_PROVIDER_SITE_OTHER): Payer: Self-pay | Admitting: Advanced Practice Midwife

## 2011-12-13 VITALS — BP 103/71 | Temp 97.7°F | Wt 145.1 lb

## 2011-12-13 DIAGNOSIS — O34219 Maternal care for unspecified type scar from previous cesarean delivery: Secondary | ICD-10-CM

## 2011-12-13 DIAGNOSIS — O9989 Other specified diseases and conditions complicating pregnancy, childbirth and the puerperium: Secondary | ICD-10-CM

## 2011-12-13 DIAGNOSIS — O2610 Low weight gain in pregnancy, unspecified trimester: Secondary | ICD-10-CM | POA: Insufficient documentation

## 2011-12-13 DIAGNOSIS — O26849 Uterine size-date discrepancy, unspecified trimester: Secondary | ICD-10-CM

## 2011-12-13 DIAGNOSIS — Z3480 Encounter for supervision of other normal pregnancy, unspecified trimester: Secondary | ICD-10-CM

## 2011-12-13 LAB — POCT URINALYSIS DIP (DEVICE)
Glucose, UA: NEGATIVE mg/dL
Hgb urine dipstick: NEGATIVE
Nitrite: NEGATIVE
Urobilinogen, UA: 0.2 mg/dL (ref 0.0–1.0)

## 2011-12-13 NOTE — Progress Notes (Signed)
Ob US scheduled on 12/19/11 @ 1500.

## 2011-12-13 NOTE — Progress Notes (Signed)
P=81,  Used Interpreter Darletta Moll, c/o pelvic pressure same as usual pressure she has,

## 2011-12-13 NOTE — Progress Notes (Signed)
Addended by: Dorathy Kinsman on: 12/13/2011 10:49 AM   Modules accepted: Orders

## 2011-12-13 NOTE — Progress Notes (Addendum)
Growth [place] scheduled for S<D, poor wt gain. Reviewed neg GBS.

## 2011-12-13 NOTE — Patient Instructions (Signed)
Trabajo de parto y parto normal (Normal Labor and Delivery) En primer lugar, su mdico debe estar seguro de que usted est en trabajo de parto. Algunos signos son:  Puede haber eliminado el "tapn mucoso" antes que comience el trabajo de parto. Se trata de una pequea cantidad de mucus con sangre.   Tiene contracciones uterinas regulares.   El tiempo entre las contracciones se acorta.   Las molestias y el dolor se hacen gradualmente ms intensos.   El dolor se ubica principalmente en la espalda.   Los dolores empeoran al caminar.   El cuello del tero (la apertura del tero se hace ms delgada, comienza a borrarse, y se abre (se dilata).  Una vez que se encuentre en trabajo de parto y sea admitida en el hospital, el mdico har lo siguiente:  Un examen fsico completo.   Controlar sus signos vitales (presin arterial, pulso, temperatura y la frecuencia cardaca fetal).   Realizar un examen vaginal (usando un guante estril y lubricante para determinar:   La posicin (presentacin) del beb (ceflica [vertex] o nalgas primero).   El nivel (plano) de la cabeza del beb en el canal de parto.   El borramiento y dilatacin del cuello del tero.   Le rasurarn el vello pbico y le aplicarn una enema segn lo considere el mdico y las circunstancias.   Generalmente se coloca un monitor electrnico sobre el abdomen. El monitor sigue la duracin e intensidad de las contracciones, as como la frecuencia cardaca del beb.   Generalmente, el profesional inserta una va intravenosa en el brazo para administrarle agua azucarada. Esta es una medida de precaucin, de modo que puedan administrarle rpidamente medicamentos durante el trabajo de parto.  EL TRABAJO DE PARTO Y PARTO NORMALES SE DIVIDEN EN 3 ETAPAS: Primera etapa Comienzan las contracciones regulares y el cuello comienza a borrarse y dilatarse. Esta etapa puede durar entre 3 y 15 horas. El final de la primera etapa se considera  cuando el cuello est borrado en un 100% y se ha dilatado 10 cm. Le administrarn analgsicos por:  Inyeccin (morfina, demerol, etc.).   Anestesia regional (espinal, caudal o epidural, anestsicos colocados en diferentes regiones de la columna vertebral). Podrn administrarle medicamentos para el dolor en la regin paracervical, que consiste en la aplicacin de un anestsico inyectable en cada uno de los lados del cuello del tero.  La embarazada puede requerir un "parto natural" , es decir no recibir medicamentos o anestesia durante el trabajo de parto y el parto. Segunda etapa En este momento el beb baja a travs del canal de parto (vagina) y nace. Esto puede durar entre 1 y 4 horas. A medida que el beb asoma la cabeza por el canal de parto, podr sentir una sensacin similar a cuando mueve el intestino. Sentir el impulse de empujar con fuerza hasta que el nio salga. A medida que la cabecita baja, el mdico decidir si realiza una episiotoma (corte en el perineo y rea de la vagina) para evitar la ruptura de los tejidos). Luego del nacimiento del beb y la expulsin de la placenta, la episiotoma se sutura. En algunos casos se coloca a la madre una mscara con xido nitroso para facilitar la respiracin y aliviar el dolor. El final de la etapa 2 se produce cuando el beb ha salido completamente. Luego, cuando el cordn umbilical deja de pulsar, se pinza y se corta. Tercera etapa La tercera etapa comienza luego que el beb ha nacido y finaliza luego de la   expulsin de la placenta. Generalmente esto lleva entre 5 y 30 minutos. Luego de la expulsin de la placenta, le aplicarn un medicamento por va intravenosa para ayudar a Engineer, materials y Psychiatric nurse. En la tercera etapa no hay dolor y generalmente no son necesarios los analgsicos. Si le han realizado una episiotoma, es el momento de Sales promotion account executive. Luego del parto, la mam es observada y controlada exhaustivamente durante 1  2 horas  para verificar que no hay sangrado en el post parto (hemorragias). Si pierde [employer], le administrarn un medicamento para Engineer, manufacturing tero y Comptroller. Document Released: 08/17/2008 Document Revised: 08/24/2011 [facility] Patient Information 2012 [place], [place].Mtodo para contar los movimientos fetales (Fetal Movement Counts) Nombre de la paciente: __________________________________________________ Franco Nones probable de parto:____________________ En los embarazos de alto riesgo se recomienda contar las pataditas, pero tambin es una buena idea que lo hagan todas las [place]. Comience a contarlas a las 28 semanas de embarazo. Los movimientos fetales aumentan luego de una comida Immunologist o de comer o beber algo dulce (el nivel de azcar en la sangre est ms alto). Tambin es importante beber gran cantidad de lquidos (hidratarse bien) antes de contar. Si se recuesta sobre el lado izquierdo mejorar la Designer, industrial/product, o puede sentarse en una silla cmoda con los brazos sobre el abdomen y sin distracciones que la rodeen. CONTANDO  Trate de contar a la [employer] lo haga.   Marque el da y la hora y vea cunto le lleva sentir 10 movimientos (patadas, agitaciones, sacudones, vueltas). Debe sentir al menos 10 movimientos en 2 horas. Probablemente sienta los 10 movimientos en menos de dos horas. Si no los siente, espere una hora y cuente nuevamente. Luego de [employer] tendr un patrn.   Debemos observar si hay cambios en el patrn o no hay suficientes pataditas en 2 horas. Le lleva ms tiempo contar los 10 movimientos?  SOLICITE ATENCIN MDICA SI:  Siente menos de 10 pataditas en 2 horas. Intntelo dos veces.   No siente movimientos durante 1 hora.   El patrn se modifica o le lleva ms tiempo Art gallery manager las 10 pataditas.   Siente que el beb no se mueve como lo hace habitualmente.  Fecha: ____________ Movimientos: ____________ Comienzo hora: ____________  Cephas Darby: ____________ Franco Nones: ____________ Movimientos: ____________ Comienzo hora: ____________ Cephas Darby: ____________ Franco Nones: ____________ Movimientos: ____________ Comienzo hora: ____________ Cephas Darby: ____________ Franco Nones: ____________ Movimientos: ____________ Comienzo hora: ____________ Cephas Darby: ____________ Franco Nones: ____________ Movimientos: ____________ Comienzo hora: ____________ Cephas Darby: ____________ Franco Nones: ____________ Movimientos: ____________ Comienzo hora: ____________ Cephas Darby: ____________ Franco Nones: ____________ Movimientos: ____________ Comienzo hora: ____________ Cephas Darby: ____________  Franco Nones: ____________ Movimientos: ____________ Comienzo hora: ____________ Cephas Darby: ____________ Franco Nones: ____________ Movimientos: ____________ Comienzo hora: ____________ Cephas Darby: ____________ Franco Nones: ____________ Movimientos: ____________ Comienzo hora: ____________ Cephas Darby: ____________ Franco Nones: ____________ Movimientos: ____________ Comienzo hora: ____________ Cephas Darby: ____________ Franco Nones: ____________ Movimientos: ____________ Comienzo hora: ____________ Cephas Darby: ____________ Franco Nones: ____________ Movimientos: ____________ Comienzo hora: ____________ Cephas Darby: ____________ Franco Nones: ____________ Movimientos: ____________ Comienzo hora: ____________ Cephas Darby: ____________  Franco Nones: ____________ Movimientos: ____________ Comienzo hora: ____________ Cephas Darby: ____________ Franco Nones: ____________ Movimientos: ____________ Comienzo hora: ____________ Cephas Darby: ____________ Franco Nones: ____________ Movimientos: ____________ Comienzo hora: ____________ Cephas Darby: ____________ Franco Nones: ____________ Movimientos: ____________ Comienzo hora: ____________ Cephas Darby: ____________ Franco Nones: ____________ Movimientos: ____________ Comienzo hora: ____________ Cephas Darby: ____________ Franco Nones: ____________ Movimientos: ____________ Comienzo hora: ____________ Cephas Darby: ____________ Franco Nones: ____________ Movimientos: ____________ Comienzo hora:  ____________ Fin hora: ____________  Fecha: ____________ Movimientos: ____________ Comienzo hora: ____________ Cephas Darby: ____________ Franco Nones: ____________ Movimientos: ____________ Comienzo hora: ____________ Cephas Darby: ____________ Franco Nones: ____________ Movimientos: ____________ Comienzo hora: ____________ Cephas Darby: ____________ Franco Nones: ____________ Movimientos: ____________ Comienzo hora: ____________ Cephas Darby: ____________ Franco Nones: ____________ Movimientos: ____________ Comienzo hora: ____________ Cephas Darby: ____________ Franco Nones: ____________ Movimientos: ____________ Comienzo hora: ____________ Cephas Darby: ____________ Franco Nones: ____________ Movimientos: ____________ Comienzo hora: ____________ Cephas Darby: ____________  Franco Nones: ____________ Movimientos: ____________ Comienzo hora: ____________ Cephas Darby: ____________ Franco Nones: ____________ Movimientos: ____________ Comienzo hora: ____________ Cephas Darby: ____________ Franco Nones: ____________ Movimientos: ____________ Comienzo hora: ____________ Cephas Darby: ____________ Franco Nones: ____________ Movimientos: ____________ Comienzo hora: ____________ Cephas Darby: ____________ Franco Nones: ____________ Movimientos: ____________ Comienzo hora: ____________ Cephas Darby: ____________ Franco Nones: ____________ Movimientos: ____________ Comienzo hora: ____________ Cephas Darby: ____________ Franco Nones: ____________ Movimientos: ____________ Comienzo hora: ____________ Cephas Darby: ____________  Franco Nones: ____________ Movimientos: ____________ Comienzo hora: ____________ Cephas Darby: ____________ Franco Nones: ____________ Movimientos: ____________ Comienzo hora: ____________ Cephas Darby: ____________ Franco Nones: ____________ Movimientos: ____________ Comienzo hora: ____________ Cephas Darby: ____________ Franco Nones: ____________ Movimientos: ____________ Comienzo hora: ____________ Cephas Darby: ____________ Franco Nones: ____________ Movimientos: ____________ Comienzo hora: ____________ Cephas Darby: ____________ Franco Nones: ____________ Movimientos: ____________  Comienzo hora: ____________ Cephas Darby: ____________ Franco Nones: ____________ Movimientos: ____________ Comienzo hora: ____________ Cephas Darby: ____________  Franco Nones: ____________ Movimientos: ____________ Comienzo hora: ____________ Cephas Darby: ____________ Franco Nones: ____________ Movimientos: ____________ Comienzo hora: ____________ Cephas Darby: ____________ Franco Nones: ____________ Movimientos: ____________ Comienzo hora: ____________ Cephas Darby: ____________ Franco Nones: ____________ Movimientos: ____________ Comienzo hora: ____________ Cephas Darby: ____________ Franco Nones: ____________ Movimientos: ____________ Comienzo hora: ____________ Cephas Darby: ____________ Franco Nones: ____________ Movimientos: ____________ Comienzo hora: ____________ Cephas Darby: ____________ Franco Nones: ____________ Movimientos: ____________ Comienzo hora: ____________ Cephas Darby: ____________  Franco Nones: ____________ Movimientos: ____________ Comienzo hora: ____________ Cephas Darby: ____________ Franco Nones: ____________ Movimientos: ____________ Comienzo hora: ____________ Cephas Darby: ____________ Franco Nones: ____________ Movimientos: ____________ Comienzo hora: ____________ Cephas Darby: ____________ Franco Nones: ____________ Movimientos: ____________ Comienzo hora: ____________ Cephas Darby: ____________ Franco Nones: ____________ Movimientos: ____________ Comienzo hora: ____________ Cephas Darby: ____________ Franco Nones: ____________ Movimientos: ____________ Comienzo hora: ____________ Cephas Darby: ____________ Franco Nones: ____________ Movimientos: ____________ Comienzo hora: ____________ Fin hora: ____________  Document Released: 12/12/2007 Document Revised: 08/24/2011 [employer] Patient Information 2012 [place], [place].

## 2011-12-19 ENCOUNTER — Ambulatory Visit (HOSPITAL_COMMUNITY)
Admission: RE | Admit: 2011-12-19 | Discharge: 2011-12-19 | Disposition: A | Discharge status: Home / Self Care | Payer: Self-pay | Source: Ambulatory Visit | Attending: Advanced Practice Midwife | Admitting: Advanced Practice Midwife

## 2011-12-19 DIAGNOSIS — Z3689 Encounter for other specified antenatal screening: Secondary | ICD-10-CM | POA: Insufficient documentation

## 2011-12-19 DIAGNOSIS — O26849 Uterine size-date discrepancy, unspecified trimester: Secondary | ICD-10-CM

## 2011-12-19 DIAGNOSIS — O0930 Supervision of pregnancy with insufficient antenatal care, unspecified trimester: Secondary | ICD-10-CM | POA: Insufficient documentation

## 2011-12-20 ENCOUNTER — Ambulatory Visit (INDEPENDENT_AMBULATORY_CARE_PROVIDER_SITE_OTHER): Payer: Self-pay | Admitting: Physician Assistant

## 2011-12-20 VITALS — BP 111/70 | Temp 97.6°F | Wt 144.9 lb

## 2011-12-20 DIAGNOSIS — O34219 Maternal care for unspecified type scar from previous cesarean delivery: Secondary | ICD-10-CM

## 2011-12-20 DIAGNOSIS — Z98891 History of uterine scar from previous surgery: Secondary | ICD-10-CM

## 2011-12-20 DIAGNOSIS — Z3480 Encounter for supervision of other normal pregnancy, unspecified trimester: Secondary | ICD-10-CM

## 2011-12-20 LAB — POCT URINALYSIS DIP (DEVICE)
Bilirubin Urine: NEGATIVE
Nitrite: NEGATIVE
pH: 7 (ref 5.0–8.0)

## 2011-12-20 NOTE — Progress Notes (Signed)
P=86 , Used Interpreter Ruta Hinds

## 2011-12-20 NOTE — Progress Notes (Signed)
No complaints. +FM, No signs of labor.

## 2011-12-20 NOTE — Patient Instructions (Signed)
Parto vaginal luego de una cesrea (Vaginal Birth After Cesarean Delivery) Un parto vaginal luego de un parto por cesrea es dar a luz por la vagina luego de haber dado a luz por medio de una intervencin quirrgica. En el pasado, si una mujer tena un beb por cesrea, todos los partos posteriores deban hacerse por cesrea. Esto ya no es as. Puede ser seguro para la mam intentar un parto vaginal luego de una cesrea. La decisin final de tener un parto vaginal o por cesrea debe tomarse en conjunto, entre la paciente y el mdico. Los riesgos y los beneficios debern evaluarse con relacin a los motivos y al tipo de cesrea previa LAS MUJERES QUE QUIEREN TENER UN PARTO VAGINAL, DEBEN CONSULTAR CON SU MDICO PARA ASEGURARSE QUE:  La cesrea anterior se realiz con una incisin uterina transversal (no con una incisin vertical clsica).   El canal de parto es lo suficientemente grande como para que pase el nio.   No ha sido sometida a otras operaciones del tero.   Durante el trabajo de parto le realizarn un monitoreo electrnico fetal, en todo momento.   Es necesario que haya un quirfano disponible y listo en caso de necesitar una cesrea de emergencia.   Un cirujano y personal de quirfano estarn disponibles en todo momento durante el trabajo de parto, para realizar una cesrea en caso de ser necesario.   Habr un anestesista disponible en caso de necesitar una cesrea de emergencia.   La nursery est lista cuenta con personal especializado y el equipo disponible para cuidar al beb en caso de emergencia.  BENEFICIOS  Permanencia ms breve en el hospital.   Menores costos en el parto, la nurse y el hospital.   Menos prdida de sangre y menos probabilidad de necesitar una transfusin sangunea.   Menos probabilidad de tener fiebre o molestias como consecuencia de una ciruga mayor   Menos riesgo de cogulos sanguneos.   Menos riesgo de sufrir infecciones.   Recuperacin ms  rpida luego del alta mdica.   Menos riesgo de complicaciones quirrgicas, como apertura o hernia de la incisin.   Disminucin del riesgo de lesiones a otros rganos   Menor riesgo de remocin del tero (histerectoma)   Menor riesgo de que la placenta cubra parcial o completamente la abertura del tero (placenta previa) en embarazos futuros   Posibilidad de tener una familia grande, si lo desea.  RIESGOS  Ruptura del tero.   Si el tero se rompe le realizarn una histerectoma.   Todas las complicaciones de una ciruga mayor y lesiones en otros rganos.   Hemorragia excesiva, cogulos e infeccin.   [place] Apgar Puntuacin Apgar baja (mtodo que evala al recin nacido segn su apariencia, pulso, muecas, actividad y respiracion) y ms riesgos para el beb.   Hay mas riesgo de ruptura del tero si se induce o aumenta el trabajo de parto.   Hay un mayor riesgo de ruptura uterina si se usan medicamentos para madurar el cuello.  NO DEBE LLEVARSE A CABO SI:  La cesrea previa se realiz con una incicin vertical (clsica) o con forma de T, o usted no sabe cul de ellas le han practicado.   Ha sufrido ruptura del tero.   Le han practicado una ciruga de tero.   Tiene problemas mdicos u obsttricos.   El beb est en problemas.   Tuvo dos cesreas previas y ningn parto vaginal.  OTRAS COSAS QUE DEBE SABER:  La anestesia peridural es segura.     Es seguro dar vuelta al beb si se encuentra de nalgas (intentar una versin ceflica externa).   Es seguro intentarlo en caso de mellizos.   Los embarazos de ms de 40 semanas no tienen xito con este procedimiento.   Hay un aumento de fracasos en embarazadas obesas.   Hay un aumento del porcentaje de fracasos si el beb pesa 4 Kg o ms.   Hay aumento en el porcentaje de fracasos si el intervalo entre la operacin cesrea y el parto vaginal es de menos de 19 meses.   Hay un aumento en el porcentaje de fracasos si ha  sufrido preeclampsia hipertensin arterial, protenas en la orina e hinchazn del rostro y las extremidades.   El parto vaginal ser muy exitoso si tuvo un parto vaginal previo.   Tambin es exitoso en el caso que el trabajo de parto comience espontneamente antes de la fecha.   El parto vaginal luego de una cesrea es similar a un parto espontneo vaginal normal.  Es importante que converse con su mdico desde comienzos del embarazo de modo que pueda comprender los riesgos, beneficios y opciones. De este modo tendr tiempo de decidir que es lo mejor en su caso particular en relacin a su parto por cesrea anterior. Hay que tener en cuenta que puede haber cambios en la madre durante el embarazo, lo que hace necesario cambiar su decisin o la del mdico. Los consejos, preocupaciones y decisiones debern documentarse en la historia clnica y debe ser firmada por todas las partes. Document Released: 02/21/2008 Document Revised: 08/24/2011 [employer] Patient Information 2012 [employer]. 

## 2011-12-27 ENCOUNTER — Ambulatory Visit (INDEPENDENT_AMBULATORY_CARE_PROVIDER_SITE_OTHER): Payer: Self-pay | Admitting: Physician Assistant

## 2011-12-27 ENCOUNTER — Encounter: Payer: Self-pay | Admitting: Advanced Practice Midwife

## 2011-12-27 VITALS — Temp 97.0°F | Wt 145.6 lb

## 2011-12-27 DIAGNOSIS — O26849 Uterine size-date discrepancy, unspecified trimester: Secondary | ICD-10-CM

## 2011-12-27 DIAGNOSIS — O9989 Other specified diseases and conditions complicating pregnancy, childbirth and the puerperium: Secondary | ICD-10-CM

## 2011-12-27 DIAGNOSIS — Z3480 Encounter for supervision of other normal pregnancy, unspecified trimester: Secondary | ICD-10-CM

## 2011-12-27 DIAGNOSIS — O34219 Maternal care for unspecified type scar from previous cesarean delivery: Secondary | ICD-10-CM

## 2011-12-27 LAB — POCT URINALYSIS DIP (DEVICE)
Ketones, ur: NEGATIVE mg/dL
Protein, ur: NEGATIVE mg/dL
Specific Gravity, Urine: 1.02 (ref 1.005–1.030)
pH: 6.5 (ref 5.0–8.0)

## 2011-12-27 NOTE — Progress Notes (Signed)
No complaints. + FM. Irregular ctx. No blding or LOF. Likely loss of mucous plug. Declines cervical exam today. 12/20/11 EFW 7#8, AFI 12cm.

## 2011-12-27 NOTE — Progress Notes (Signed)
Pulse- 83  Pain-abd, lower back  Pressure- pelvic, vaginal.  Vaginal discharge- mucousy with blood tinged

## 2011-12-27 NOTE — Patient Instructions (Signed)
Parto vaginal luego de una cesrea (Vaginal Birth After Cesarean Delivery) Un parto vaginal luego de un parto por cesrea es dar a luz por la vagina luego de haber dado a luz por medio de una intervencin quirrgica. En el pasado, si una mujer tena un beb por cesrea, todos los partos posteriores deban hacerse por cesrea. Esto ya no es as. Puede ser seguro para la mam intentar un parto vaginal luego de una cesrea. La decisin final de tener un parto vaginal o por cesrea debe tomarse en conjunto, entre la paciente y el mdico. Los riesgos y los beneficios debern evaluarse con relacin a los motivos y al tipo de cesrea previa LAS MUJERES QUE QUIEREN TENER UN PARTO VAGINAL, DEBEN CONSULTAR CON SU MDICO PARA ASEGURARSE QUE:  La cesrea anterior se realiz con una incisin uterina transversal (no con una incisin vertical clsica).   El canal de parto es lo suficientemente grande como para que pase el nio.   No ha sido sometida a otras operaciones del tero.   Durante el trabajo de parto le realizarn un monitoreo electrnico fetal, en todo momento.   Es necesario que haya un quirfano disponible y listo en caso de necesitar una cesrea de emergencia.   Un cirujano y personal de quirfano estarn disponibles en todo momento durante el trabajo de parto, para realizar una cesrea en caso de ser necesario.   Habr un anestesista disponible en caso de necesitar una cesrea de emergencia.   La nursery est lista cuenta con personal especializado y el equipo disponible para cuidar al beb en caso de emergencia.  BENEFICIOS  Permanencia ms breve en el hospital.   Menores costos en el parto, la nurse y el hospital.   Menos prdida de sangre y menos probabilidad de necesitar una transfusin sangunea.   Menos probabilidad de tener fiebre o molestias como consecuencia de una ciruga mayor   Menos riesgo de cogulos sanguneos.   Menos riesgo de sufrir infecciones.   Recuperacin ms  rpida luego del alta mdica.   Menos riesgo de complicaciones quirrgicas, como apertura o hernia de la incisin.   Disminucin del riesgo de lesiones a otros rganos   Menor riesgo de remocin del tero (histerectoma)   Menor riesgo de que la placenta cubra parcial o completamente la abertura del tero (placenta previa) en embarazos futuros   Posibilidad de tener una familia grande, si lo desea.  RIESGOS  Ruptura del tero.   Si el tero se rompe le realizarn una histerectoma.   Todas las complicaciones de una ciruga mayor y lesiones en otros rganos.   Hemorragia excesiva, cogulos e infeccin.   [place] Apgar Puntuacin Apgar baja (mtodo que evala al recin nacido segn su apariencia, pulso, muecas, actividad y respiracion) y ms riesgos para el beb.   Hay mas riesgo de ruptura del tero si se induce o aumenta el trabajo de parto.   Hay un mayor riesgo de ruptura uterina si se usan medicamentos para madurar el cuello.  NO DEBE LLEVARSE A CABO SI:  La cesrea previa se realiz con una incicin vertical (clsica) o con forma de T, o usted no sabe cul de ellas le han practicado.   Ha sufrido ruptura del tero.   Le han practicado una ciruga de tero.   Tiene problemas mdicos u obsttricos.   El beb est en problemas.   Tuvo dos cesreas previas y ningn parto vaginal.  OTRAS COSAS QUE DEBE SABER:  La anestesia peridural es segura.     Es seguro dar vuelta al beb si se encuentra de nalgas (intentar una versin ceflica externa).   Es seguro intentarlo en caso de mellizos.   Los embarazos de ms de 40 semanas no tienen xito con este procedimiento.   Hay un aumento de fracasos en embarazadas obesas.   Hay un aumento del porcentaje de fracasos si el beb pesa 4 Kg o ms.   Hay aumento en el porcentaje de fracasos si el intervalo entre la operacin cesrea y el parto vaginal es de menos de 19 meses.   Hay un aumento en el porcentaje de fracasos si ha  sufrido preeclampsia hipertensin arterial, protenas en la orina e hinchazn del rostro y las extremidades.   El parto vaginal ser muy exitoso si tuvo un parto vaginal previo.   Tambin es exitoso en el caso que el trabajo de parto comience espontneamente antes de la fecha.   El parto vaginal luego de una cesrea es similar a un parto espontneo vaginal normal.  Es importante que converse con su mdico desde comienzos del embarazo de modo que pueda comprender los riesgos, beneficios y opciones. De este modo tendr tiempo de decidir que es lo mejor en su caso particular en relacin a su parto por cesrea anterior. Hay que tener en cuenta que puede haber cambios en la madre durante el embarazo, lo que hace necesario cambiar su decisin o la del mdico. Los consejos, preocupaciones y decisiones debern documentarse en la historia clnica y debe ser firmada por todas las partes. Document Released: 02/21/2008 Document Revised: 08/24/2011 [employer] Patient Information 2012 [employer]. 

## 2011-12-28 ENCOUNTER — Encounter (HOSPITAL_COMMUNITY): Payer: Self-pay | Admitting: *Deleted

## 2011-12-28 ENCOUNTER — Inpatient Hospital Stay (HOSPITAL_COMMUNITY)
Admission: AD | Admit: 2011-12-28 | Discharge: 2011-12-30 | DRG: 775 | Disposition: A | Discharge status: Home / Self Care | Payer: Medicaid Other | Source: Ambulatory Visit | Attending: Obstetrics and Gynecology | Admitting: Obstetrics and Gynecology

## 2011-12-28 ENCOUNTER — Encounter (HOSPITAL_COMMUNITY): Payer: Self-pay | Admitting: Anesthesiology

## 2011-12-28 ENCOUNTER — Inpatient Hospital Stay (HOSPITAL_COMMUNITY): Payer: Medicaid Other | Admitting: Anesthesiology

## 2011-12-28 DIAGNOSIS — O34219 Maternal care for unspecified type scar from previous cesarean delivery: Secondary | ICD-10-CM

## 2011-12-28 DIAGNOSIS — IMO0001 Reserved for inherently not codable concepts without codable children: Secondary | ICD-10-CM

## 2011-12-28 LAB — HEPATITIS B SURFACE ANTIGEN: Hepatitis B Surface Ag: NEGATIVE

## 2011-12-28 LAB — CBC
Hemoglobin: 12.1 g/dL (ref 12.0–15.0)
MCHC: 32.6 g/dL (ref 30.0–36.0)
RDW: 14.5 % (ref 11.5–15.5)

## 2011-12-28 LAB — RPR: RPR Ser Ql: NONREACTIVE

## 2011-12-28 MED ORDER — EPHEDRINE 5 MG/ML INJ
10.0000 mg | INTRAVENOUS | Status: DC | PRN
  Filled 2011-12-28: qty 4

## 2011-12-28 MED ORDER — PHENYLEPHRINE 40 MCG/ML (10ML) SYRINGE FOR IV PUSH (FOR BLOOD PRESSURE SUPPORT)
80.0000 ug | PREFILLED_SYRINGE | INTRAVENOUS | Status: DC | PRN
  Filled 2011-12-28: qty 5

## 2011-12-28 MED ORDER — FENTANYL 2.5 MCG/ML BUPIVACAINE 1/10 % EPIDURAL INFUSION (WH - ANES)
14.0000 mL/h | INTRAMUSCULAR | Status: DC
  Administered 2011-12-28: 14 mL/h via EPIDURAL
  Filled 2011-12-28: qty 60

## 2011-12-28 MED ORDER — DIBUCAINE 1 % RE OINT: 1.0000 "application " | TOPICAL_OINTMENT | RECTAL | Status: DC | PRN

## 2011-12-28 MED ORDER — SIMETHICONE 80 MG PO CHEW: 80.0000 mg | CHEWABLE_TABLET | ORAL | Status: DC | PRN

## 2011-12-28 MED ORDER — PRENATAL MULTIVITAMIN CH
1.0000 | ORAL_TABLET | Freq: Every day | ORAL | Status: DC
  Administered 2011-12-29: 1 via ORAL
  Filled 2011-12-28: qty 1

## 2011-12-28 MED ORDER — DIPHENHYDRAMINE HCL 50 MG/ML IJ SOLN: 12.5000 mg | INTRAMUSCULAR | Status: DC | PRN

## 2011-12-28 MED ORDER — TETANUS-DIPHTH-ACELL PERTUSSIS 5-2.5-18.5 LF-MCG/0.5 IM SUSP: 0.5000 mL | Freq: Once | INTRAMUSCULAR | Status: DC

## 2011-12-28 MED ORDER — OXYCODONE-ACETAMINOPHEN 5-325 MG PO TABS
1.0000 | ORAL_TABLET | ORAL | Status: DC | PRN
  Administered 2011-12-29: 1 via ORAL
  Filled 2011-12-28: qty 1

## 2011-12-28 MED ORDER — CITRIC ACID-SODIUM CITRATE 334-500 MG/5ML PO SOLN: 30.0000 mL | ORAL | Status: DC | PRN

## 2011-12-28 MED ORDER — LACTATED RINGERS IV SOLN
INTRAVENOUS | Status: DC
Start: 2011-12-28 — End: 2011-12-28
  Administered 2011-12-28: 12:00:00 via INTRAVENOUS
  Administered 2011-12-28: 125 mL/h via INTRAVENOUS

## 2011-12-28 MED ORDER — ONDANSETRON HCL 4 MG/2ML IJ SOLN: 4.0000 mg | INTRAMUSCULAR | Status: DC | PRN

## 2011-12-28 MED ORDER — SENNOSIDES-DOCUSATE SODIUM 8.6-50 MG PO TABS
2.0000 | ORAL_TABLET | Freq: Every day | ORAL | Status: DC
  Administered 2011-12-28 – 2011-12-29 (×2): 2 via ORAL

## 2011-12-28 MED ORDER — IBUPROFEN 600 MG PO TABS: 600.0000 mg | ORAL_TABLET | Freq: Four times a day (QID) | ORAL | Status: DC | PRN

## 2011-12-28 MED ORDER — LIDOCAINE HCL (PF) 1 % IJ SOLN
INTRAMUSCULAR | Status: DC | PRN
  Administered 2011-12-28 (×2): 8 mL

## 2011-12-28 MED ORDER — WITCH HAZEL-GLYCERIN EX PADS: 1.0000 "application " | MEDICATED_PAD | CUTANEOUS | Status: DC | PRN

## 2011-12-28 MED ORDER — IBUPROFEN 600 MG PO TABS
600.0000 mg | ORAL_TABLET | Freq: Four times a day (QID) | ORAL | Status: DC
  Administered 2011-12-28 – 2011-12-30 (×8): 600 mg via ORAL
  Filled 2011-12-28 (×8): qty 1

## 2011-12-28 MED ORDER — ONDANSETRON HCL 4 MG/2ML IJ SOLN: 4.0000 mg | Freq: Four times a day (QID) | INTRAMUSCULAR | Status: DC | PRN

## 2011-12-28 MED ORDER — LACTATED RINGERS IV SOLN
500.0000 mL | Freq: Once | INTRAVENOUS | Status: AC
  Administered 2011-12-28: 500 mL via INTRAVENOUS

## 2011-12-28 MED ORDER — ZOLPIDEM TARTRATE 5 MG PO TABS: 5.0000 mg | ORAL_TABLET | Freq: Every evening | ORAL | Status: DC | PRN

## 2011-12-28 MED ORDER — PHENYLEPHRINE 40 MCG/ML (10ML) SYRINGE FOR IV PUSH (FOR BLOOD PRESSURE SUPPORT): 80.0000 ug | PREFILLED_SYRINGE | INTRAVENOUS | Status: DC | PRN

## 2011-12-28 MED ORDER — OXYTOCIN 20 UNITS IN LACTATED RINGERS INFUSION - SIMPLE
125.0000 mL/h | Freq: Once | INTRAVENOUS | Status: AC
  Administered 2011-12-28: 125 mL/h via INTRAVENOUS

## 2011-12-28 MED ORDER — FENTANYL 2.5 MCG/ML BUPIVACAINE 1/10 % EPIDURAL INFUSION (WH - ANES)
INTRAMUSCULAR | Status: DC | PRN
  Administered 2011-12-28: 14 mL/h via EPIDURAL

## 2011-12-28 MED ORDER — OXYTOCIN BOLUS FROM INFUSION
500.0000 mL | Freq: Once | INTRAVENOUS | Status: DC
  Filled 2011-12-28: qty 500
  Filled 2011-12-28: qty 1000

## 2011-12-28 MED ORDER — BENZOCAINE-MENTHOL 20-0.5 % EX AERO: 1.0000 "application " | INHALATION_SPRAY | CUTANEOUS | Status: DC | PRN

## 2011-12-28 MED ORDER — LIDOCAINE HCL (PF) 1 % IJ SOLN
30.0000 mL | INTRAMUSCULAR | Status: DC | PRN
  Filled 2011-12-28: qty 30

## 2011-12-28 MED ORDER — LACTATED RINGERS IV SOLN: 500.0000 mL | INTRAVENOUS | Status: DC | PRN

## 2011-12-28 MED ORDER — LANOLIN HYDROUS EX OINT: TOPICAL_OINTMENT | CUTANEOUS | Status: DC | PRN

## 2011-12-28 MED ORDER — FLEET ENEMA 7-19 GM/118ML RE ENEM: 1.0000 | ENEMA | RECTAL | Status: DC | PRN

## 2011-12-28 MED ORDER — ONDANSETRON HCL 4 MG PO TABS: 4.0000 mg | ORAL_TABLET | ORAL | Status: DC | PRN

## 2011-12-28 MED ORDER — EPHEDRINE 5 MG/ML INJ: 10.0000 mg | INTRAVENOUS | Status: DC | PRN

## 2011-12-28 MED ORDER — ACETAMINOPHEN 325 MG PO TABS: 650.0000 mg | ORAL_TABLET | ORAL | Status: DC | PRN

## 2011-12-28 MED ORDER — DIPHENHYDRAMINE HCL 25 MG PO CAPS: 25.0000 mg | ORAL_CAPSULE | Freq: Four times a day (QID) | ORAL | Status: DC | PRN

## 2011-12-28 MED ORDER — OXYCODONE-ACETAMINOPHEN 5-325 MG PO TABS: 1.0000 | ORAL_TABLET | ORAL | Status: DC | PRN

## 2011-12-28 NOTE — Anesthesia Procedure Notes (Signed)
Epidural Patient location during procedure: OB Start time: 12/28/2011 12:19 PM End time: 12/28/2011 12:22 PM Reason for block: procedure for pain  Staffing Anesthesiologist: Sandrea Hughs Performed by: anesthesiologist   Preanesthetic Checklist Completed: patient identified, site marked, surgical consent, pre-op evaluation, timeout performed, IV checked, risks and benefits discussed and monitors and equipment checked  Epidural Patient position: sitting Prep: site prepped and draped and DuraPrep Patient monitoring: continuous pulse ox and blood pressure Approach: midline Injection technique: LOR air  Needle:  Needle type: Tuohy  Needle gauge: 17 G Needle length: 9 cm Needle insertion depth: 5 cm cm Catheter type: closed end flexible Catheter size: 19 Gauge Catheter at skin depth: 10 cm Test dose: negative and Other  Assessment Sensory level: T9 Events: blood not aspirated, injection not painful, no injection resistance, negative IV test and no paresthesia

## 2011-12-28 NOTE — Progress Notes (Signed)
In to assess pt. Unable to speak or follow any commands other than eye movement, both arms limp, unable cough om demand. Called Dr Arby Barrette, order to turn off epidural

## 2011-12-28 NOTE — Progress Notes (Addendum)
Pt seems to be in and out of consciousness, called Dr Arby Barrette to come and evaluate pt. [facility] interpreter in to translate.

## 2011-12-28 NOTE — Anesthesia Postprocedure Evaluation (Signed)
  Anesthesia Post-op Note  Patient: Caitlyn Clark  Procedure(s) Performed: * No procedures listed *  Patient Location: Mother/Baby  Anesthesia Type: Epidural  Level of Consciousness: awake, alert  and oriented  Airway and Oxygen Therapy: Patient Spontanous Breathing  Post-op Pain: mild  Post-op Assessment: Patient's Cardiovascular Status Stable, Respiratory Function Stable, Patent Airway, No signs of Nausea or vomiting and Pain level controlled  Post-op Vital Signs: stable  Complications: No apparent anesthesia complications

## 2011-12-28 NOTE — MAU Note (Signed)
Case worker present with pt.   Pt does not plan for pain meds or anes.

## 2011-12-28 NOTE — MAU Note (Signed)
Translator paged, contracting every (front and back- Scientific laboratory technician)

## 2011-12-28 NOTE — Progress Notes (Signed)
Caitlyn Clark is a 21 y.o. G3P2002 at [redacted]w[redacted]d   Subjective: Epidural causing numbness from the neck down. Anesthesia notified and examined pt. Epidural has been turned off. Respiratory effort preserved.   Objective: BP 124/78  Pulse 72  Temp(Src) 97.7 F (36.5 C) (Oral)  Resp 22  Ht 5' 1.5" (1.562 m)  Wt 66.225 kg (146 lb)  BMI 27.14 kg/m2  SpO2 98%  LMP 03/25/2011      FHT:  FHR: 130s bpm, variability: moderate,  accelerations:  Present,  decelerations:  Absent UC:   regular, every 2 minutes SVE:   Dilation: 9 Effacement (%): 90 Station: 0 Exam by: Caitlyn Flatten, MD  Labs: Lab Results  Component Value Date   WBC 9.0 12/28/2011   HGB 12.1 12/28/2011   HCT 37.1 12/28/2011   MCV 87.9 12/28/2011   PLT 276 12/28/2011    Asse3ssment / Plan: Spontaneous labor, progressing normally  Labor: Progressing normally Fetal Wellbeing:  Category I Pain Control:  Epidural has been turned off due to excessive numbness.  I/D:  n/a Anticipated MOD:  NSVD  Eriq Hufford 12/28/2011, 2:07 PM

## 2011-12-28 NOTE — Progress Notes (Signed)
RN charge nurse and rapid response nurse in to assess pt

## 2011-12-28 NOTE — Anesthesia Preprocedure Evaluation (Signed)

## 2011-12-28 NOTE — H&P (Signed)
Caitlyn Clark is a 21 y.o. female [redacted]w[redacted]d presenting for Labor wants to [facility]. Previous C-section in [place] due to NRFHT.   Maternal Medical History:  Reason for admission: Reason for admission: contractions.  Reason for Admission:   nauseaContractions: Onset was 6-12 hours ago.   Frequency: regular.   Perceived severity is moderate.    Fetal activity: Perceived fetal activity is normal.   Last perceived fetal movement was within the past hour.      OB History    Grav Para Term Preterm Abortions TAB SAB Ect Mult Living   3 2 2  0 0 0 0 0 0 2     Past Medical History  Diagnosis Date  . No pertinent past medical history    Past Surgical History  Procedure Date  . Cesarean section    Family History: family history includes Diabetes in her father.  There is no history of Anesthesia problems. Social History:  reports that she has never smoked. She has never used smokeless tobacco. She reports that she does not drink alcohol or use illicit drugs.  Review of Systems  Constitutional: Negative for fever and chills.  Eyes: Negative for blurred vision and double vision.  Respiratory: Negative for shortness of breath.   Cardiovascular: Negative for chest pain.  Gastrointestinal: Negative for nausea, vomiting, abdominal pain, diarrhea, constipation and blood in stool.  Genitourinary: Negative for dysuria, urgency and hematuria.  Musculoskeletal: Negative for myalgias.  Skin: Negative for itching and rash.  Neurological: Negative for headaches.    Dilation: 6.5 Effacement (%): 80 Exam by:: jolynn Blood pressure 125/83, pulse 70, temperature 98 F (36.7 C), temperature source Oral, resp. rate 20, height 5' 1.5" (1.562 m), weight 66.225 kg (146 lb), last menstrual period 03/25/2011. Maternal Exam:  Uterine Assessment: Contraction strength is moderate.  Contraction frequency is regular.   Abdomen: Surgical scars: low transverse.   Fundal height is Term.   Fetal  presentation: vertex  Introitus: Normal vulva. Pelvis: adequate for delivery.      Physical Exam  Constitutional: She is oriented to person, place, and time. She appears well-nourished.  Eyes: EOM are normal.  Neck: Normal range of motion.  Cardiovascular: Normal rate and regular rhythm.   Respiratory: Effort normal.  GI: There is no tenderness. There is no rebound and no guarding.  Genitourinary: Vagina normal and uterus normal.  Musculoskeletal: Normal range of motion.  Neurological: She is alert and oriented to person, place, and time.  Skin: Skin is warm and dry.  Psychiatric: She has a normal mood and affect. Her behavior is normal.    Prenatal labs: ABO, Rh:   Antibody:   Rubella:   RPR: NON REAC (02/06 1152)  HBsAg:    HIV:    GBS:     Assessment/Plan: 20yo [redacted]w[redacted]d G3P2002 presenting in labor and desiring TOLAC and epidural.  - Epidural for pain - Continue fetal monitoring - Expectant management for VBAC  Mercer Stallworth 12/28/2011, 11:42 AM

## 2011-12-28 NOTE — Progress Notes (Signed)
Patient seen and examined with Dr Konrad Dolores.  Agree with above note.

## 2011-12-29 DIAGNOSIS — O34219 Maternal care for unspecified type scar from previous cesarean delivery: Secondary | ICD-10-CM | POA: Diagnosis not present

## 2011-12-29 MED ORDER — IBUPROFEN 600 MG PO TABS: 600.0000 mg | ORAL_TABLET | Freq: Four times a day (QID) | ORAL | Status: AC

## 2011-12-29 MED ORDER — BENZOCAINE-MENTHOL 20-0.5 % EX AERO
INHALATION_SPRAY | CUTANEOUS | Status: AC
  Filled 2011-12-29: qty 56

## 2011-12-29 MED ORDER — GUAIFENESIN 100 MG/5ML PO SOLN
200.0000 mg | ORAL | Status: DC | PRN
  Administered 2011-12-29 (×2): 200 mg via ORAL
  Filled 2011-12-29: qty 118
  Filled 2011-12-29 (×2): qty 15

## 2011-12-29 NOTE — Progress Notes (Signed)
Clinical Social Work Department  PSYCHOSOCIAL ASSESSMENT - MATERNAL/CHILD  12/29/2011  Patient: Caitlyn Clark,Hadli Account Number: [id number] Admit Date: 12/28/2011  Childs Name:  BG Trinidad-Monodo   Clinical Social Worker: Wilma Wuthrich, LCSWA Date/Time: 12/29/2011 11:15 AM  Date Referred: 12/29/2011  Referral source   CN    Referred reason   LPNC   Other referral source:  I: FAMILY / HOME ENVIRONMENT  Child's legal guardian: PARENT  Guardian - Name  Guardian - Age  Guardian - Address   Caitlyn Clark  20  [address] [place], [place] [address]   Caitlyn  22    Other household support members/support persons  Name  Relationship  DOB   Caitlyn Clark  SON  2   Other support:  II PSYCHOSOCIAL DATA  Information Source: Patient Interview  Financial and Community Resources  Employment:  Financial resources: Self Pay  If Medicaid - County:  Other   WIC   School / Grade:  Maternity Care Coordinator / Child Services Coordination / Early Interventions: Cultural issues impacting care:  III STRENGTHS  Strengths   Adequate Resources   Home prepared for Child (including basic supplies)   Supportive family/friends   Strength comment:  IV RISK FACTORS AND CURRENT PROBLEMS  Current Problem: None  Risk Factor & Current Problem  Patient Issue  Family Issue  Risk Factor / Current Problem Comment    Y  N  LPNC   V SOCIAL WORK ASSESSMENT  Pt told Sw that she [facility] to [facility] to [facility] but [facility] due to insufficient paper work. She denies any illegal substance use. UDS is negative, meconium results are pending. She reports having all the necessary supplies for the infant and adequate family support. Sw will follow up with drug screen results and make a referral if needed.   VI SOCIAL WORK PLAN  Social Work Plan   No Further Intervention Required / No Barriers to Discharge   Type of pt/family education:  If child protective  services report - county:  If child protective services report - date:  Information/referral to community resources comment:  Other social work plan:     

## 2011-12-29 NOTE — Progress Notes (Signed)
Post Partum Day 1 Subjective: no complaints, up ad lib, voiding, tolerating PO and + flatus  Objective: Blood pressure 111/71, pulse 99, temperature 97.9 F (36.6 C), temperature source Oral, resp. rate 20, height 5' 1.5" (1.562 m), weight 66.225 kg (146 lb), last menstrual period 03/25/2011, SpO2 99.00%, unknown if currently breastfeeding.  Physical Exam:  General: alert, cooperative and no distress Lochia: appropriate Uterine Fundus: firm Incision: n/a DVT Evaluation: No evidence of DVT seen on physical exam. Negative Homan's sign. No cords or calf tenderness. No significant calf/ankle edema.   Basename 12/28/11 1140  HGB 12.1  HCT 37.1    Assessment/Plan: Breastfeeding, possible early d/c today, undecided on contraception   LOS: 1 day   Caitlyn Clark 12/29/2011, 8:09 AM

## 2011-12-29 NOTE — Discharge Summary (Signed)
Obstetric Discharge Summary Reason for Admission: onset of labor Prenatal Procedures: ultrasound Intrapartum Procedures: spontaneous vaginal delivery Postpartum Procedures: none Complications-Operative and Postpartum: 2nd degree perineal and 1st degree periurethral lacerations Hemoglobin  Date Value Range Status  12/28/2011 12.1  12.0-15.0 (g/dL) Final     HCT  Date Value Range Status  12/28/2011 37.1  36.0-46.0 (%) Final    Physical Exam:  General: alert, cooperative and no distress Lochia: appropriate Uterine Fundus: firm Incision: NA DVT Evaluation: Negative Homan's sign.  Discharge Diagnoses: Term Pregnancy-delivered  Discharge Information: Date: 12/29/2011 Activity: pelvic rest Diet: routine Medications: Ibuprofen Condition: stable Instructions: refer to practice specific booklet Discharge to: home Follow-up Information    Follow up with [facility] in 6 weeks.   Contact information:   [address] [place] [address]       Follow up with [facility]. (As needed if symptoms worsen)    Contact information:   [address] [place] [address] [phone]         Newborn Data: Live born female  Birth Weight: 7 lb 5 oz (3317 g) APGAR: 9, 9  Home with mother.  Caitlyn Clark 12/29/2011, 12:45 PM

## 2011-12-29 NOTE — Progress Notes (Signed)
UR Chart review completed.  

## 2011-12-30 MED ORDER — SENNOSIDES-DOCUSATE SODIUM 8.6-50 MG PO TABS: 2.0000 | ORAL_TABLET | Freq: Every day | ORAL | Status: AC

## 2011-12-30 NOTE — Discharge Summary (Signed)
Agree with note, saw patient.

## 2011-12-30 NOTE — Discharge Summary (Signed)
Obstetric Discharge Summary Reason for Admission: onset of labor Prenatal Procedures: none Intrapartum Procedures: spontaneous vaginal delivery Postpartum Procedures: none Complications-Operative and Postpartum: 2nd  degree perineal laceration Hemoglobin  Date Value Range Status  12/28/2011 12.1  12.0-15.0 (g/dL) Final     HCT  Date Value Range Status  12/28/2011 37.1  36.0-46.0 (%) Final    Physical Exam:  General: alert, cooperative, appears stated age and no distress Lochia: appropriate Uterine Fundus: firm DVT Evaluation: No evidence of DVT seen on physical exam. Trace LE edema  Discharge Diagnoses: Term Pregnancy-delivered  Discharge Information: Date: 12/30/2011 Activity: unrestricted and pelvic rest Diet: routine Medications: Ibuprofen, Colace and Percocet Condition: stable Instructions: refer to practice specific booklet Discharge to: home Follow-up Information    Follow up with [facility] in 6 weeks.   Contact information:   [address] [place] [address]       Follow up with [facility]. (As needed if symptoms worsen)    Contact information:   [address] [place] [address] [phone]         Newborn Data: Live born female  Birth Weight: 7 lb 5 oz (3317 g) APGAR: 9, 9  Home with mother.  Caitlyn Clark 12/30/2011, 7:25 AM

## 2011-12-31 NOTE — Progress Notes (Signed)
Pt seen and examined.  Agree with above note. 

## 2012-01-01 NOTE — H&P (Signed)
Patient seen and examined.  Agree with above note.

## 2012-01-02 ENCOUNTER — Encounter: Payer: Self-pay | Admitting: Obstetrics and Gynecology

## 2012-01-02 NOTE — Discharge Summary (Signed)
Attestation of Attending Supervision of Advanced Practitioner: Evaluation and management procedures were performed by the [facility] Fellow/PA/CNM/NP under my supervision and collaboration. Chart reviewed, and agree with management and plan.  Jaynie Collins, M.D. 01/02/2012 11:26 AM

## 2012-01-03 ENCOUNTER — Encounter: Payer: Self-pay | Admitting: Family Medicine

## 2012-01-29 ENCOUNTER — Encounter: Payer: Self-pay | Admitting: Obstetrics and Gynecology

## 2012-01-29 ENCOUNTER — Ambulatory Visit (INDEPENDENT_AMBULATORY_CARE_PROVIDER_SITE_OTHER): Payer: Self-pay | Admitting: Obstetrics and Gynecology

## 2012-01-29 VITALS — BP 95/61 | HR 64 | Temp 97.5°F | Ht 64.0 in | Wt 132.7 lb

## 2012-01-29 DIAGNOSIS — IMO0001 Reserved for inherently not codable concepts without codable children: Secondary | ICD-10-CM

## 2012-01-29 DIAGNOSIS — Z309 Encounter for contraceptive management, unspecified: Secondary | ICD-10-CM

## 2012-01-29 NOTE — Patient Instructions (Signed)
Cuidados luego de un parto por vía vaginal °(Postpartum Care After Vaginal Delivery) °Luego del nacimiento del bebé deberá permanecer en el hospital durante 24 a 72 horas, excepto que hubiera existido algún problema, o usted sufra alguna enfermedad. Mientras se encuentre en el hospital recibirá ayuda e instrucciones por parte de las enfermeras y el médico, quienes cuidarán de usted y su bebé y le darán consejos para amamantarlo correctamente, especialmente si es el primer hijo.  °En caso de ser necesario, le prescribirán analgésicos. Observará una pequeña hemorragia vaginal y deberá cambiar los apósitos con frecuencia. Lávese las manos cuidadosamente con agua y jabón durante al menos 20 segundos luego de cambiarse el apósito o ir al baño. Si elimina coágulos o aumenta la hemorragia, infórmelo a la enfermera. No deseche los coágulos sanguíneos antes de mostrárselos a la enfermera, para asegurarse de que no es tejido placentario. °Si le han colocado una vía intravenosa, se la retirarán dentro de las 24 horas, si no hay problemas. La primera vez que se levante de la cama o tome una ducha, llame a la enfermera para que la ayude que puede sentirse débil, mareada o desmayarse. Si está amamantando, puede sentir contracciones dolorosas en el útero durante algunas semanas. Esto es normal y necesario, ya que de este modo el útero vuelve a su tamaño normal. Si no está amamantando, utilice un sostén de soporte y trate de no tocarse las mamas hasta que haya dejado de producir leche. No deben administrarse hormonas para suprimir la leche, debido a que pueden causar coágulos sanguíneos. Podrá seguir una dieta normal, excepto que sufra diabetes o presente otros problemas de salud.  °La enfermera colocará bolsas con hielo en el sitio de la episiotomía (agrandamiento quirúrgico de la apertura vaginal) para reducir el dolor y la hinchazón. En algunos casos raros hay dificultad para orinar, entonces la enfermera deberá vaciarle la  vejiga con un catéter. Si le han practicado una ligadura tubaria durante el posparto ("trompas atadas", esterilización femenina), esto no hará que permanezca más tiempo en el hospital. °Podrá tener al bebé en su habitación todo el tiempo que lo desee si el bebé no tiene ningún problema. Lleve y traiga al bebé de la nursery dentro de la cunita. No lo lleve en brazos. No abandone el área de posparto. Si la madre es Rh negativa (falta de una proteína en los glóbulos rojos) y el bebé es Rh positivo, la madre debe aplicarse la vacuna RhoGam para evitar problemas con el factor Rh en futuros embarazos °Le darán instrucciones por escrito para usted y el bebé y los medicamentos necesarios cuando reciba el alta médica. Asegúrese que comprende y sigue las indicaciones. °INSTRUCCIONES PARA EL CUIDADO DOMICILIARIO °· Siga las instrucciones y tome los medicamentos que le indicaron cuando le dieron el alta médica.  °· Utilice los medicamentos de venta libre o de prescripción para el dolor, el malestar o la fiebre, según se lo indique el profesional que lo asiste.  °· No tome aspirina, ya que puede causar hemorragias.  °· Aumente sus actividades un poco cada día para tener más fuerza y resistencia.  °· No beba alcohol, especialmente si está amamantando o toma analgésicos.  °· Tómese la temperatura dos veces por día y regístrela.  °· Podrá tener una pequeña hemorragia durante 2 a 4 semanas. Esto es normal.  °· No utilice tampones o duchas vaginales, use toallas higiénicas.  °· Trate de que alguna persona permanezca con usted y la ayude durante los primeros días en el hogar.  °·   Descanse o duerma una siesta cuando el bebé duerma.  °· Si está amamantando, use un buen sostén. Si no está amamantando, use un buen sostén y no estimule los pezones.  °· Consuma una dieta sana y siga tomando las vitaminas prenatales.  °· No conduzca vehículos, no realice actividades pesadas ni viaje hasta que su médico la autorice.  °· No mantenga relaciones  sexuales hasta que el médico lo permita.  °· Consulte con el profesional cuando puede comenzar a realizar actividad física y que tipo de ejercicios puede hacer.  °· Comuníquese inmediatamente con el médico si tiene problemas luego del parto.  °· Comuníquese con el pediatra si tiene problemas con el bebé.  °· Programe su visita de control luego del parto y cúmplala.  °SOLICITE ATENCIÓN MÉDICA SI: °· La temperatura se eleva por encima de 100° F (37.8° C).  °· Aumenta la hemorragia vaginal o elimina coágulos. Conserve algunos coágulos para mostrárselos al médico.  °· Observa sangre o siente dolor al orinar.  °· Presenta secreción vaginal con olor fétido.  °· Aumenta el dolor o la inflamación en el sitio de la episiotomía (agrandamiento quirúrgico de la apertura vaginal).  °· Sufre una cefalea grave.  °· Se siente deprimida.  °· La incisión se abre.  °· Se siente mareada o sufre un desmayo.  °· Aparece una erupción cutánea.  °· Tiene una reacción o problemas con su medicamento.  °· Siente dolor u observa enrojecimiento e hinchazón en el sitio de la vía intravenosa.  °SOLICITE ATENCIÓN MÉDICA DE INMEDIATO SI: °· Siente dolor en el pecho.  °· Comienza a sentir falta de aire.  °· Se desmaya.  °· Siente dolor, con o sin hinchazón e irritación en la pierna.  °· Tiene una hemorragia vaginal abundante, con o sin coágulos  °· Siente dolor en el estómago.  °· Observa una secreción vaginal con mal olor.  °ASEGURESE QUE:  °· Comprende estas instrucciones.  °· Controlará su enfermedad.  °· Solicitará ayuda de inmediato si no mejora o empeora.  °Document Released: 07/02/2007 Document Revised: 08/24/2011 °Exit[employer]® Patient Information ©2012 [employer]. °

## 2012-01-29 NOTE — Progress Notes (Signed)
  Subjective:     Caitlyn Clark is a 21 y.o. female who presents for a postpartum visit. She is 4 weeks postpartum following a spontaneous vaginal delivery. I have fully reviewed the prenatal and intrapartum course. The delivery was at 39.4 gestational weeks. Outcome: spontaneous vaginal delivery. VBAC. Anesthesia: epidural. Postpartum course has been uncomlicated. Baby's course has been uncomplicated. Baby is feeding by breast. Bleeding no bleeding. Bowel function is normal. Bladder function is normal. Patient is not sexually active. Contraception method is none. Postpartum depression screening: negative.  The following portions of the patient's history were reviewed and updated as appropriate: allergies, current medications, past family history, past medical history, past social history, past surgical history and problem list.  Review of Systems Pertinent items are noted in HPI.   Objective:    BP 95/61  Pulse 64  Temp(Src) 97.5 F (36.4 C) (Oral)  Ht 5\' 4"  (1.626 m)  Wt 132 lb 11.2 oz (60.192 kg)  BMI 22.78 kg/m2  General:  alert, cooperative, appears stated age and no distress   Breasts:  negative Lactational  Lungs: clear to auscultation bilaterally  Heart:  regular rate and rhythm, S1, S2 normal, no murmur, click, rub or gallop  Abdomen: soft, non-tender; bowel sounds normal; no masses,  no organomegaly C/S scar vertical   Vulva:  normal lac well healed  Vagina: normal vagina sutures noted in vagina, not inflamed, sl indurated  Cervix:  anteverted, no cervical motion tenderness and no lesions  Corpus: normal size, contour, position, consistency, mobility, non-tender  Adnexa:  normal adnexa  Rectal Exam: Not performed.        Assessment:    4 wk postpartum exam. Pap smear not done at today's visit.   Plan:    1. Contraception: Nexplanon if able to pay.  2. F/U in 2 wks after 2 wks abstince for Nexplanon.  3. Follow up in: 10 months for Pap or as needed.

## 2013-08-18 IMAGING — US US OB COMP +14 WK
1 series · 12 of 28 positions shown · non-contrast
Comparison: none

[Series 1: us ob comp +14 wk · 12 of 53 slices shown]
[im 2/53]
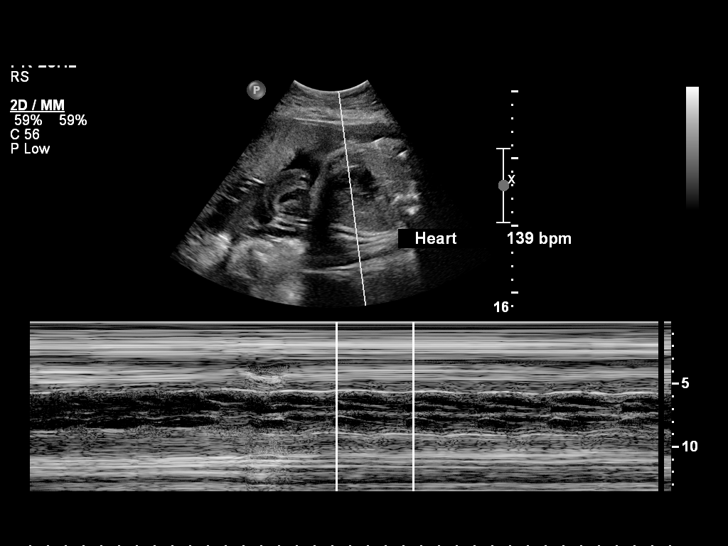
[im 6/53]
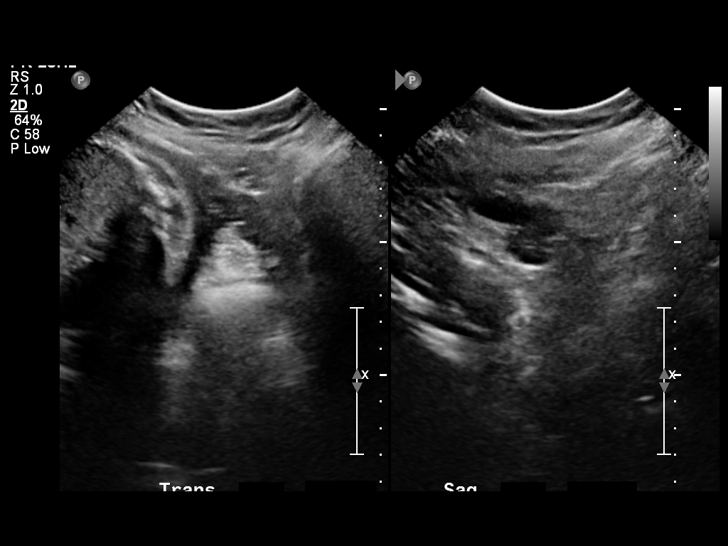
[im 10/53]
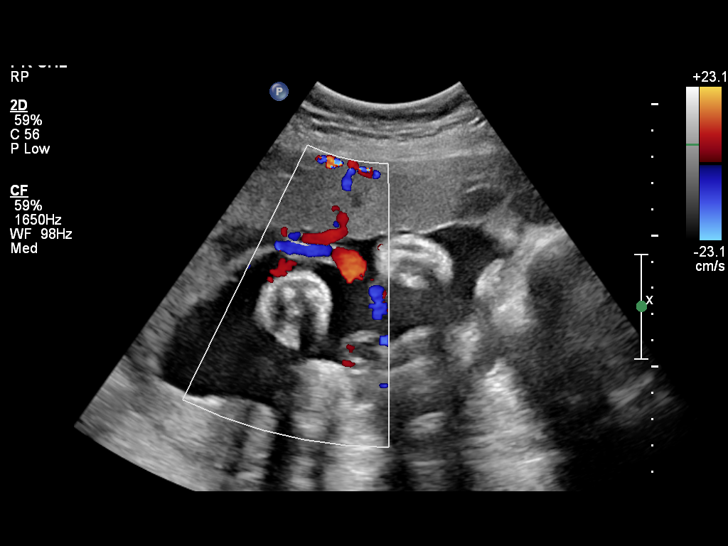
[im 16/53]
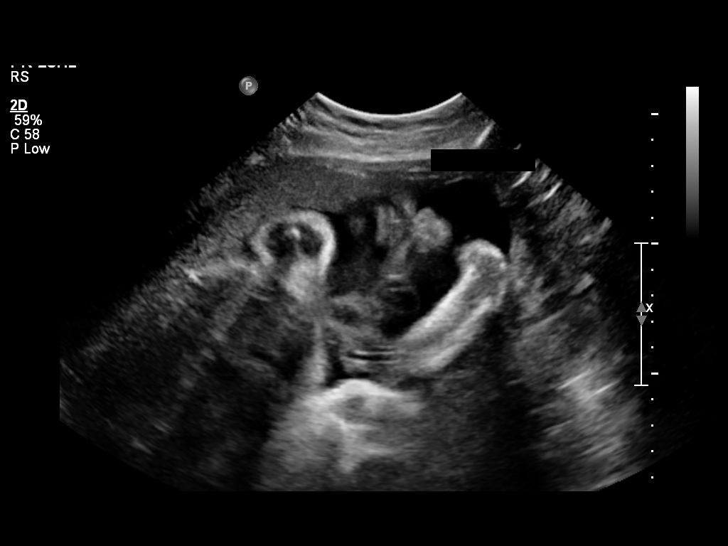
[im 20/53]
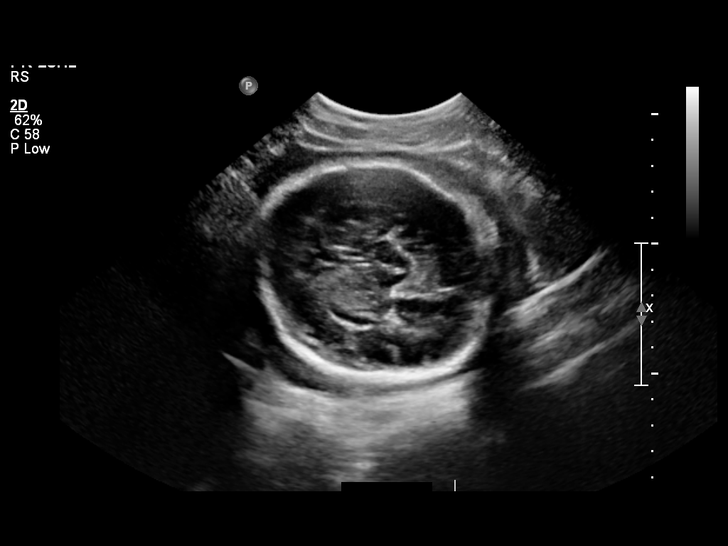
[im 24/53]
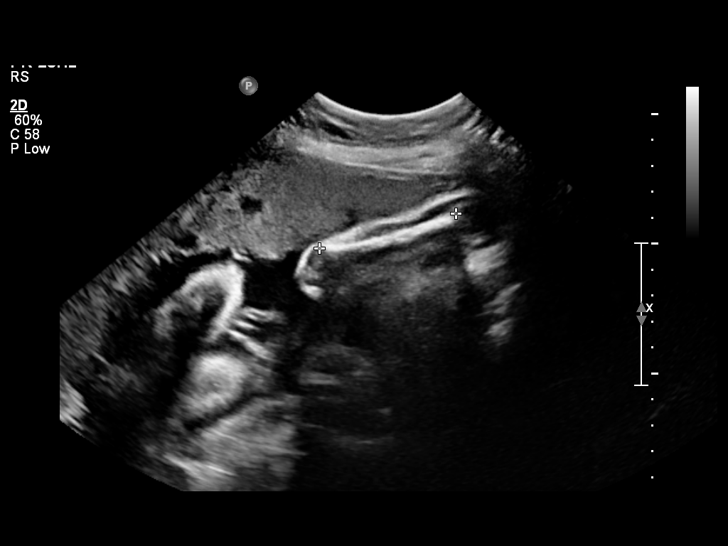
[im 29/53]
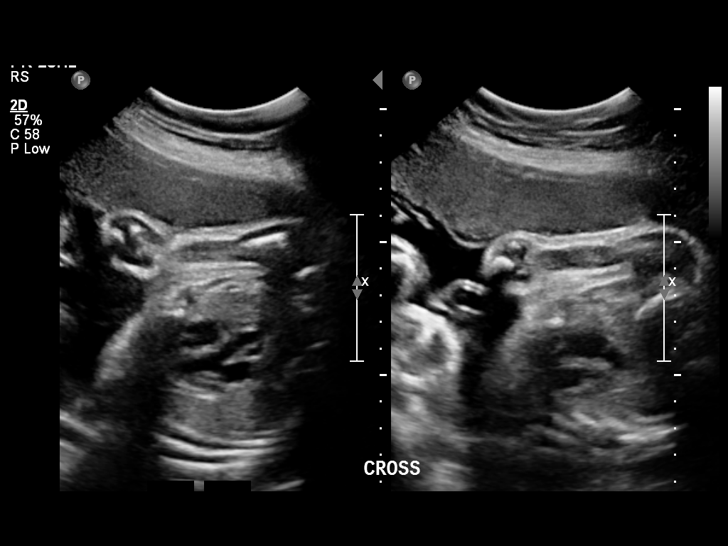
[im 33/53]
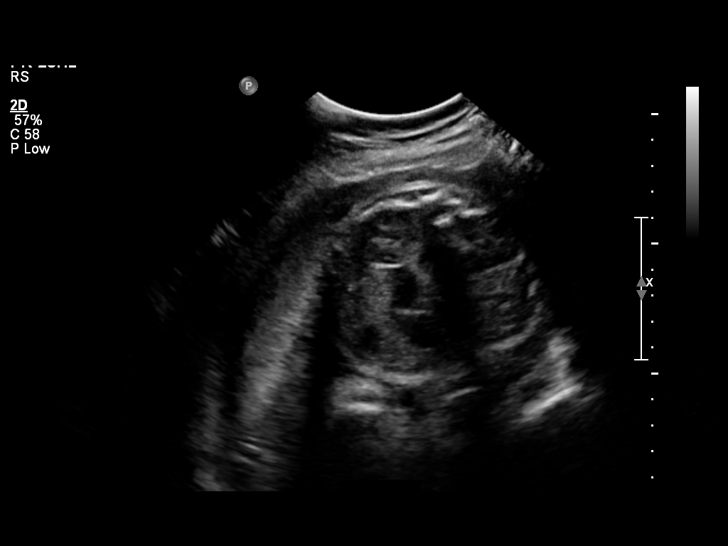
[im 37/53]
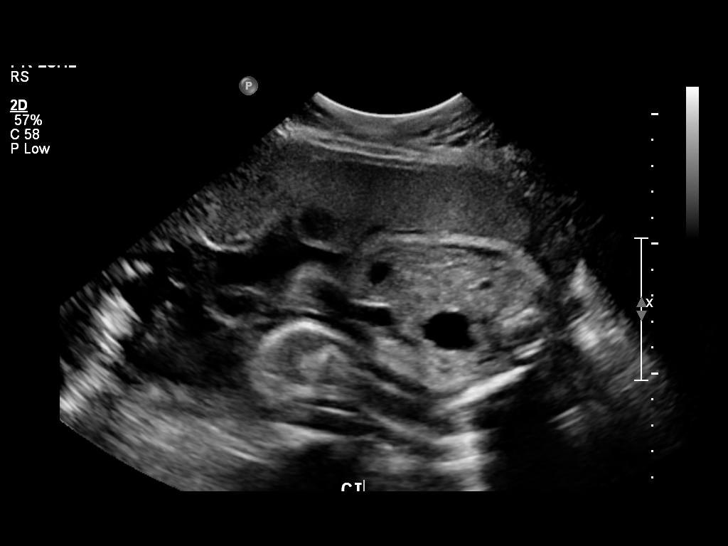
[im 43/53]
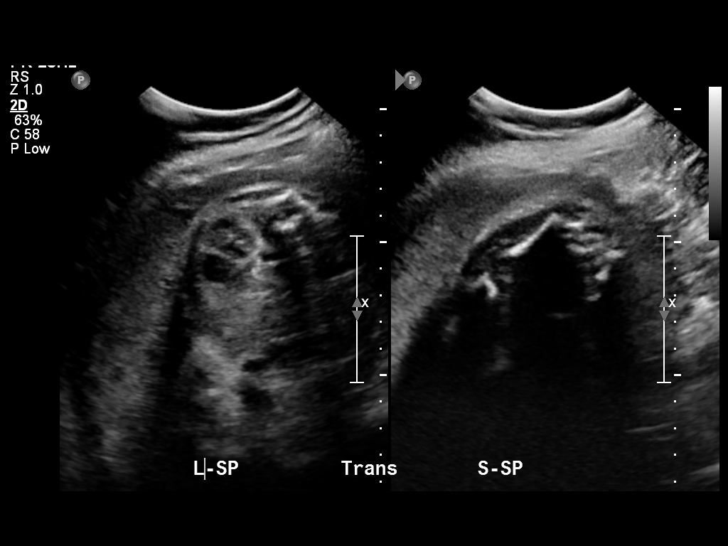
[im 47/53]
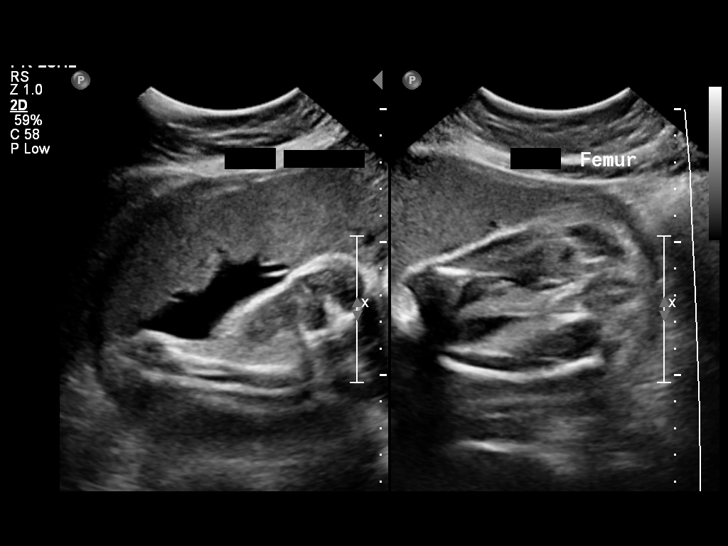
[im 51/53]
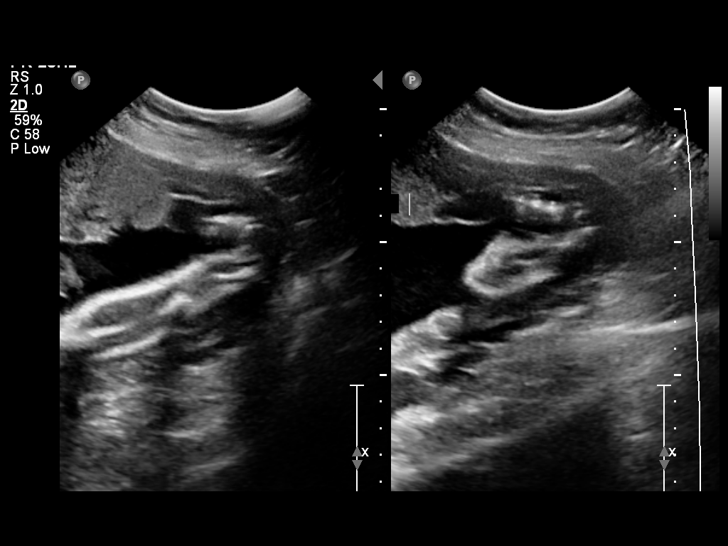

[12 of 28 positions shown; findings below may reference images not displayed]

OBSTETRICS REPORT
                      (Signed Final 10/27/2011 [DATE])

          MONDONO

 Order#:         [id number]
Procedures

 US OB COMP + 14 WK                                    76805.1
Indications

 Routine fetal anatomic survey
 Late to prenatal care
Fetal Evaluation

 Fetal Heart Rate:  139                         bpm
 Cardiac Activity:  Observed
 Presentation:      Cephalic
 Placenta:          Anterior, above cervical os
 P. Cord            Visualized
 Insertion:

 Amniotic Fluid
 AFI FV:      Subjectively within normal limits
 AFI Sum:     14.43   cm      50   %Tile     Larg Pckt:   4.32   cm
 RUQ:   4.32   cm    RLQ:    3.62   cm    LUQ:   3.17    cm   LLQ:    3.32   cm
Biometry

 BPD:     80.2  mm    G. Age:   32w 1d                CI:        77.02   70 - 86
                                                      FL/HC:      21.0   19.3 -

 HC:     289.4  mm    G. Age:   31w 6d       41  %    HC/AC:      1.04   0.96 -

 AC:     278.4  mm    G. Age:   32w 0d       76  %    FL/BPD:     75.7   71 - 87
 FL:      60.7  mm    G. Age:   31w 4d       55  %    FL/AC:      21.8   20 - 24
 HUM:     54.2  mm    G. Age:   31w 4d       66  %

 Est. FW:    8967  gm      4 lb 1 oz     74  %
Gestational Age

 LMP:           30w 6d       Date:   03/25/11                 EDD:   12/30/11
 U/S Today:     31w 6d                                        EDD:   12/23/11
 Best:          30w 6d    Det. By:   LMP  (03/25/11)          EDD:   12/30/11
Anatomy
 Cranium:           Appears normal      Aortic Arch:       Not well
                                                           visualized
 Fetal Cavum:       Appears normal      Ductal Arch:       Not well
                                                           visualized
 Ventricles:        Appears normal      Diaphragm:         Appears normal
 Choroid Plexus:    Appears normal      Stomach:           Appears
                                                           normal, left
                                                           sided
 Cerebellum:        Appears normal      Abdomen:           Appears normal
 Posterior Fossa:   Appears normal      Abdominal Wall:    Not well
                                                           visualized
 Nuchal Fold:       Not applicable      Cord Vessels:      Appears normal
                    (>20 wks GA)                           (3 vessel cord)
 Face:              Lips and orbits     Kidneys:           Appear normal
                    appear normal
 Heart:             Appears normal      Bladder:           Appears normal
                    (4 chamber &
                    axis)
 RVOT:              Appears normal      Spine:             Appears normal
 LVOT:              Appears normal      Limbs:             Four extremities
                                                           seen

 Other:     Female gender. Technically difficult due to advanced GA
            and fetal position.
Cervix Uterus Adnexa

 Cervical Length:   3.1       cm

 Cervix:       Closed.

 Adnexa:     No abnormality visualized.
Impression

   Single living intrauterine gestation with concordant
 gestational age (within one week of LMP date)  and normal
 visualized anatomy.

## 2013-10-10 IMAGING — US US OB FOLLOW-UP
1 series · 12 of 28 positions shown · non-contrast
Comparison: none

[Series 1: us ob follow up · 12 of 47 slices shown]
[im 2/47]
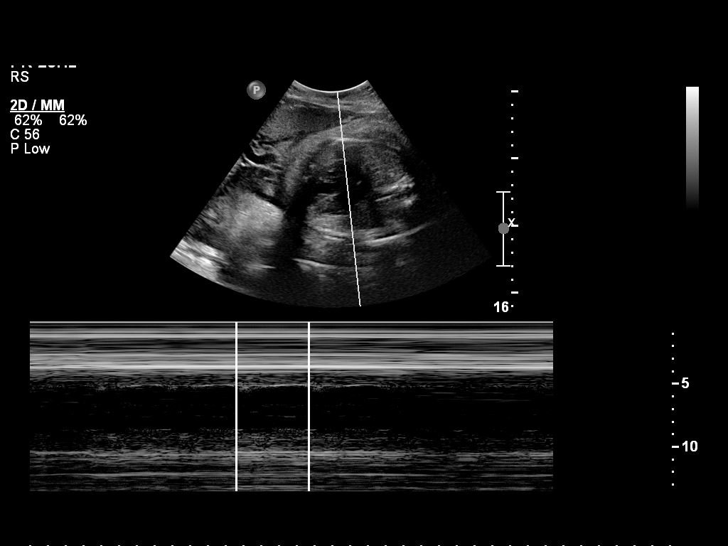
[im 6/47]
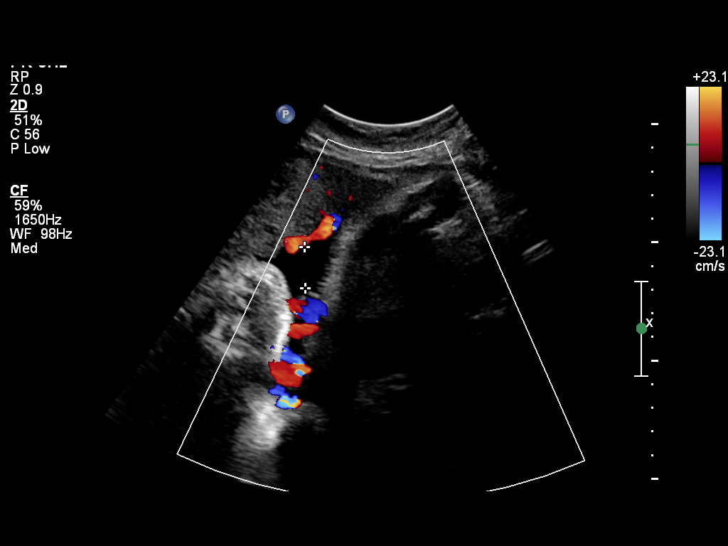
[im 9/47]
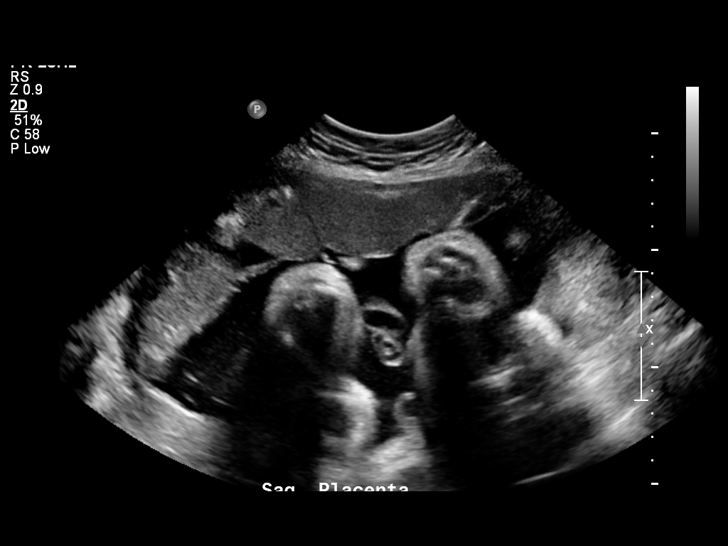
[im 14/47]
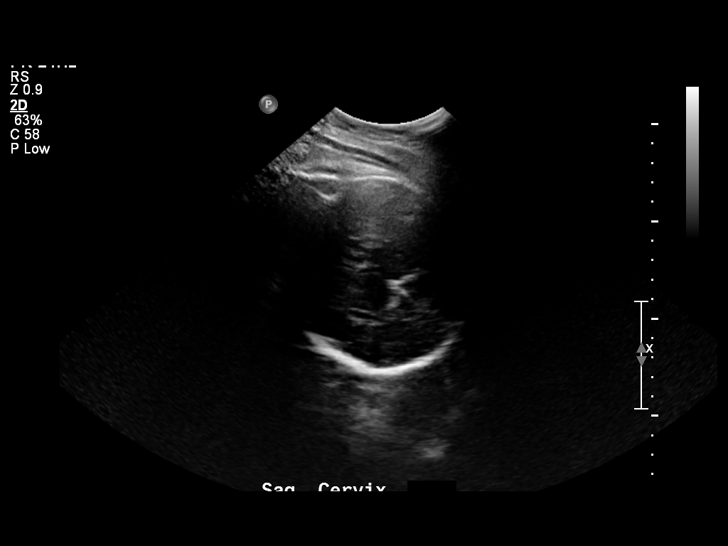
[im 18/47]
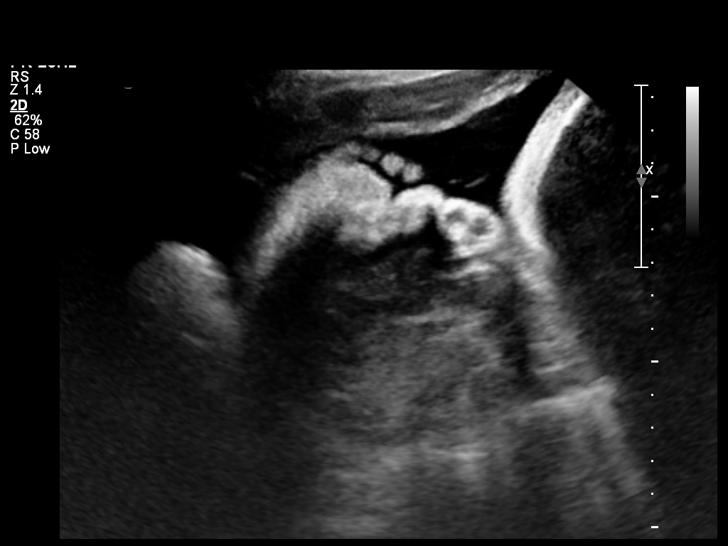
[im 21/47]
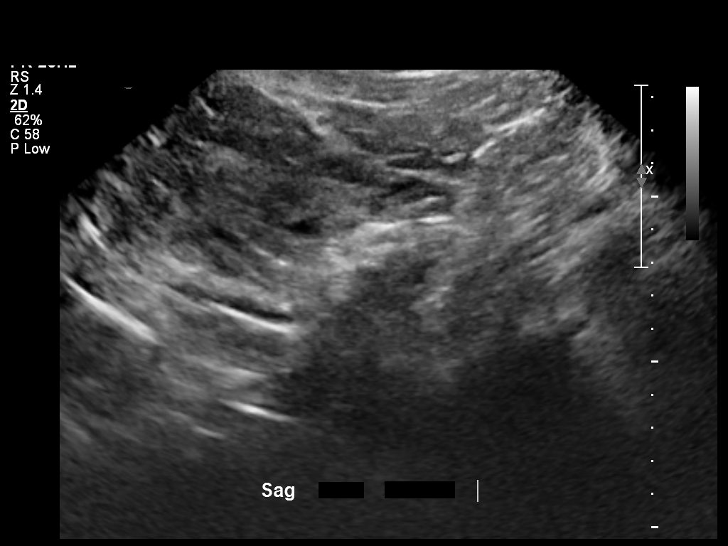
[im 26/47]
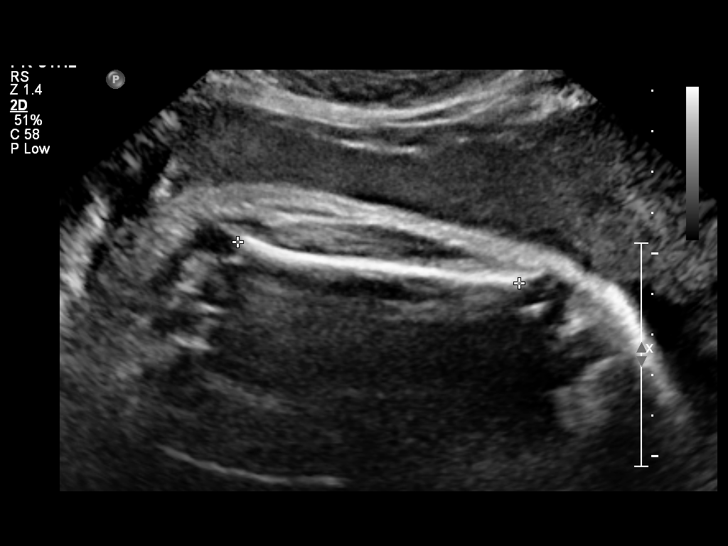
[im 29/47]
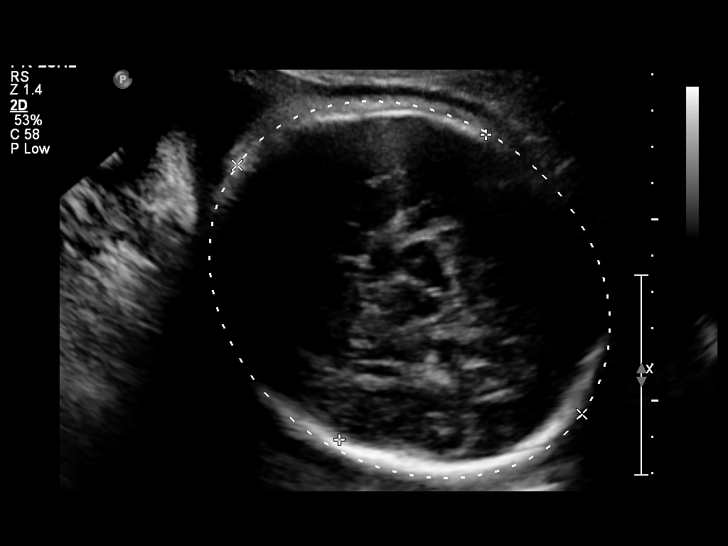
[im 33/47]
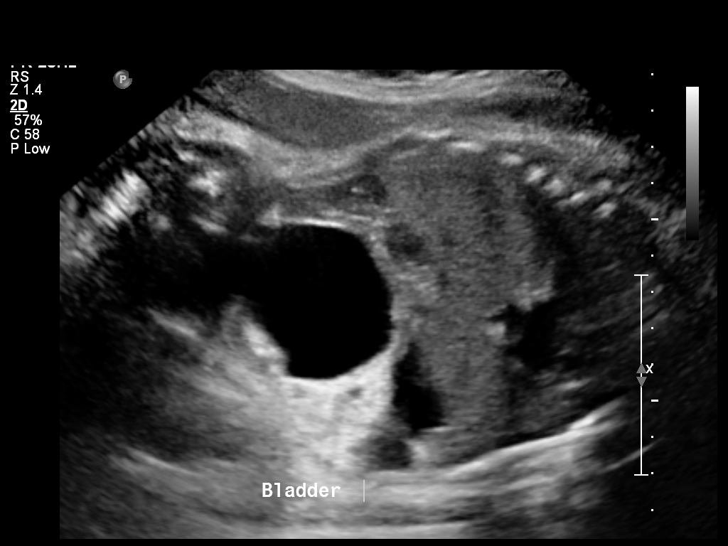
[im 38/47]
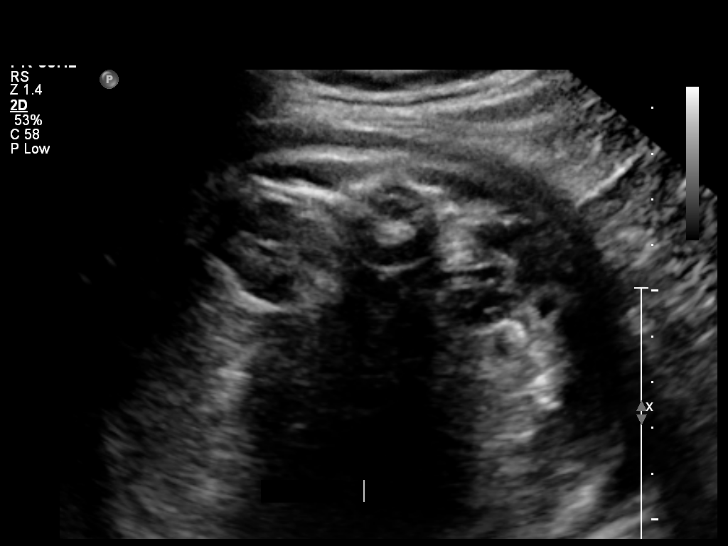
[im 41/47]
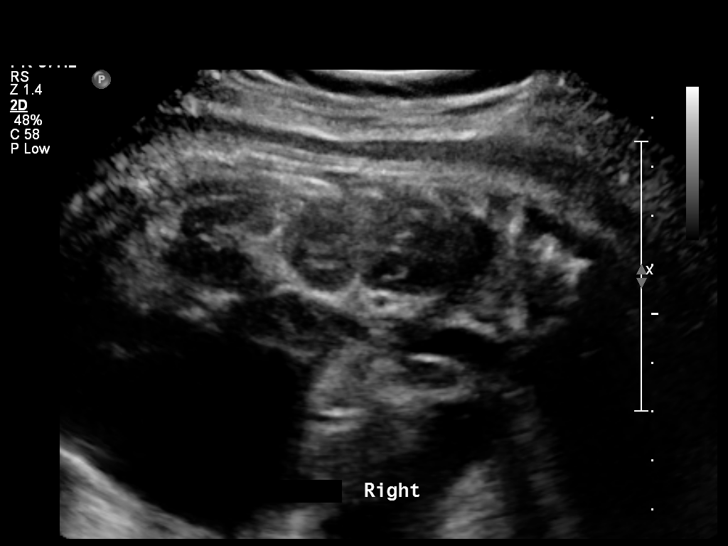
[im 45/47]
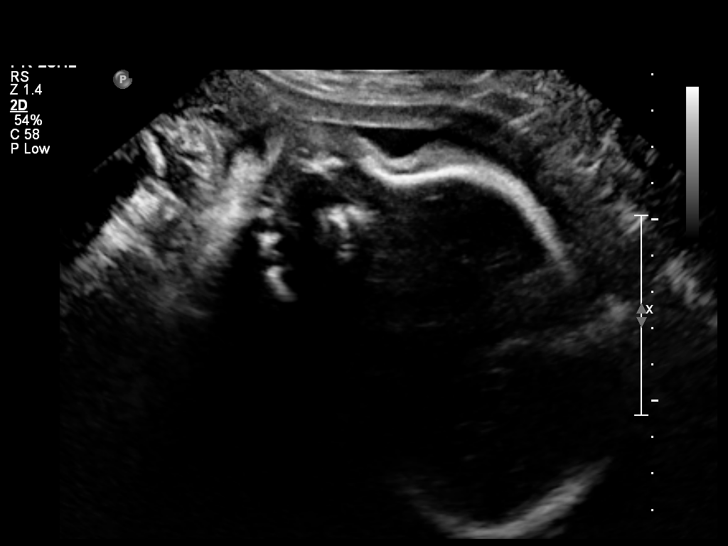

[12 of 28 positions shown; findings below may reference images not displayed]

OBSTETRICS REPORT
                      (Signed Final 12/19/2011 [DATE])

          MONDONO

 Order#:         [id number]
Procedures

 US OB FOLLOW UP                                       76816.1
Indications

 Late to prenatal care
 Poor weight gain
Fetal Evaluation

 Fetal Heart Rate:  147                         bpm
 Cardiac Activity:  Observed
 Presentation:      Cephalic
 Placenta:          Anterior, above cervical os
 P. Cord            Visualized
 Insertion:

 Amniotic Fluid
 AFI FV:      Subjectively within normal limits
 AFI Sum:     12.12   cm      43   %Tile     Larg Pckt:   3.75   cm
 RUQ:   3.24   cm    RLQ:    3.75   cm    LUQ:   1.74    cm   LLQ:    3.39   cm
Biometry

 BPD:     90.9  mm    G. Age:   36w 6d                CI:        75.36   70 - 86
                                                      FL/HC:      22.0   20.9 -

 HC:     332.1  mm    G. Age:   37w 6d       22  %    HC/AC:      0.95   0.92 -

 AC:     349.1  mm    G. Age:   38w 6d       78  %    FL/BPD:     80.3   71 - 87
 FL:        73  mm    G. Age:   37w 3d       28  %    FL/AC:      20.9   20 - 24

 Est. FW:    9623  gm      7 lb 8 oz     76  %
Gestational Age

 LMP:           38w 3d       Date:   03/25/11                 EDD:   12/30/11
 U/S Today:     37w 5d                                        EDD:   01/04/12
 Best:          38w 3d    Det. By:   LMP  (03/25/11)          EDD:   12/30/11
Anatomy

 Cranium:           Appears normal      Aortic Arch:       Basic anatomy
                                                           exam per order
 Fetal Cavum:       Appears normal      Ductal Arch:       Basic anatomy
                                                           exam per order
 Ventricles:        Appears normal      Diaphragm:         Appears normal
 Choroid Plexus:    Previously seen     Stomach:           Appears
                                                           normal, left
                                                           sided
 Cerebellum:        Previously seen     Abdomen:           Previously seen
 Posterior Fossa:   Previously seen     Abdominal Wall:    Not well
                                                           visualized
 Nuchal Fold:       Not applicable      Cord Vessels:      Previously seen
                    (>20 wks GA)
 Face:              Lips and orbits     Kidneys:           Appear normal
                    previously seen
 Heart:             Appears normal      Bladder:           Appears normal
                    (4 chamber &
                    axis)
 RVOT:              Previously seen     Spine:             Previously seen
 LVOT:              Previously seen     Limbs:             Previously seen

 Other:     Female gender. Technically difficult due to advanced GA
            and fetal position.
Cervix Uterus Adnexa

 Cervix:       Not visualized (advanced GA >34 wks)

 Adnexa:     No abnormality visualized.
Impression

   Single living intrauterine gestation in cephalic presentation
 with concordant gestational age and fetal indices.  The EFW
 today is at the 76th percentile.

## 2014-07-20 ENCOUNTER — Encounter: Payer: Self-pay | Admitting: Obstetrics and Gynecology

## 2016-11-29 ENCOUNTER — Ambulatory Visit: Payer: Medicaid Other | Attending: Family Medicine | Admitting: Family Medicine

## 2016-11-29 VITALS — BP 95/60 | HR 70 | Temp 98.0°F | Resp 18 | Ht 63.0 in | Wt 173.8 lb

## 2016-11-29 DIAGNOSIS — K029 Dental caries, unspecified: Secondary | ICD-10-CM

## 2016-11-29 MED ORDER — IBUPROFEN 600 MG PO TABS: 600.0000 mg | ORAL_TABLET | Freq: Three times a day (TID) | ORAL | 0 refills | Status: DC | PRN

## 2016-11-29 MED ORDER — BENZOCAINE 10 % MT GEL: 1.0000 "application " | OROMUCOSAL | 0 refills | Status: DC | PRN

## 2016-11-29 MED FILL — IBUPROFEN 600 MG TABLET: 600 | 10 days supply | Qty: 30 | Fill #0

## 2016-11-29 NOTE — Progress Notes (Signed)
Patient is here for referral to dentist  Patient has some sensitive in her teeth  Patient is not on any current meds  Patient has eaten today   Patient declined the flu shot today

## 2016-11-29 NOTE — Progress Notes (Signed)
   Subjective:  Patient ID: Caitlyn Clark, female    DOB: 06/27/1991  Age: 26 y.o. MRN: [id number]  CC: Establish Care   HPI Caitlyn Clark presents for   Dental problem: 5 months ago. Reports moderate tooth pain, with occasional swelling of gumline without drainage and tooth sensitivity. Denies any loose teeth. Reports never seeing a dentist before. Reports taking Advil and Tylenol for tooth pain.     Outpatient Medications Prior to Visit  Medication Sig Dispense Refill  . prenatal vitamin w/FE, FA (PRENATAL 1 + 1) 27-1 MG TABS Take 1 tablet by mouth daily.       No facility-administered medications prior to visit.     ROS Review of Systems  HENT: Positive for dental problem.   Respiratory: Negative.   Cardiovascular: Negative.         Objective:  BP 95/60 (BP Location: Left Arm, Patient Position: Sitting, Cuff Size: Normal)   Pulse 70   Temp 98 F (36.7 C) (Oral)   Resp 18   Ht 5\' 3"  (1.6 m)   Wt 173 lb 12.8 oz (78.8 kg)   SpO2 99%   BMI 30.79 kg/m   BP/Weight 11/29/2016 01/29/2012 12/30/2011  Systolic BP 95 95 92  Diastolic BP 60 61 53  Wt. (Lbs) 173.8 132.7 -  BMI 30.79 22.77 -     Physical Exam  HENT:  Head: Normocephalic.  Right Ear: External ear normal.  Left Ear: External ear normal.  Nose: Nose normal.  Mouth/Throat: Uvula is midline, oropharynx is clear and moist and mucous membranes are normal. Dental caries present.    Eyes: Conjunctivae are normal. Pupils are equal, round, and reactive to light.  Cardiovascular: Normal rate, regular rhythm, normal heart sounds and intact distal pulses.   Pulmonary/Chest: Effort normal and breath sounds normal.  Lymphadenopathy:    She has no cervical adenopathy.  Nursing note and vitals reviewed.   Assessment & Plan:   Problem List Items Addressed This Visit    None    Visit Diagnoses    Tooth caries    -  Primary   Relevant Orders   Ambulatory referral to Dentistry   Pain due  to dental caries       Relevant Medications   ibuprofen (ADVIL,MOTRIN) 600 MG tablet   benzocaine (ORAJEL) 10 % mucosal gel      Meds ordered this encounter  Medications  . ibuprofen (ADVIL,MOTRIN) 600 MG tablet    Sig: Take 1 tablet (600 mg total) by mouth every 8 (eight) hours as needed (Take with food).    Dispense:  30 tablet    Refill:  0    Order Specific Question:   Supervising Provider    Answer:   Quentin AngstJEGEDE, OLUGBEMIGA E [id number]]  . benzocaine (ORAJEL) 10 % mucosal gel    Sig: Use as directed 1 application in the mouth or throat as needed for mouth pain.    Dispense:  5.3 g    Refill:  0    Order Specific Question:   Supervising Provider    Answer:   Quentin AngstJEGEDE, OLUGBEMIGA E [[id number]]    Follow-up: Return in about 2 weeks (around 12/13/2016) for Physical .   Lizbeth BarkMandesia R Gabryella Murfin FNP

## 2016-11-29 NOTE — Patient Instructions (Signed)
Caries dentales en los adultos (Dental Caries, Adult) Las caries dentales son manchas de deterioro en la capa externa del diente (esmalte). Las bacterias naturales de la boca producen un cido al degradar las bebidas y los alimentos azucarados. Al consumir muchos alimentos y lquidos azucarados, se produce mucho cido. El cido destruye el esmalte protector de los dientes, y esto produce caries. Es importante tratar una caries lo antes posible. Si no se trata, se puede diseminar y causar una infeccin dolorosa. Cepillarse regularmente con pasta dental con flor (higiene bucal) y realizarse controles dentales peridicos puede ayudar a prevenir las caries. CAUSAS Las caries dentales son causadas por el cido que se produce cuando las bacterias degradan las bebidas y los alimentos azucarados o cidos. FACTORES DE RIESGO Es ms probable que esta afeccin se manifieste en los adultos jvenes. Tambin es ms probable que esta afeccin se manifieste en las personas que:  Beben muchos lquidos azucarados, incluidas las bebidas alcohlicas, como el champn.  Comen muchos dulces y carbohidratos.  Beben agua que no est tratada con flor.  Tienen una higiene bucal deficiente.  Tienen surcos profundos en los dientes.  Tomen ciertos medicamentos que disminuyen la saliva. SNTOMAS Los sntomas de las caries dentales incluyen:  Manchas blancas, marrones o blancas en los dientes.  Dolor.  Hinchazn o sangrado en las encas. DIAGNSTICO El dentista puede sospechar que hay caries al observar sus signos y sntomas. Tambin realizar un examen bucal. Esto puede incluir radiografas para confirmar el diagnstico. En ocasiones, se utilizan luces, una sonda delgada y sustancias de contraste para encontrar caries (uso de conductividad elctrica o uso de reflexin lser). TRATAMIENTO El tratamiento de las caries suele consistir en un procedimiento para retirar el deterioro y restaurar el diente con un relleno  o un sellador. INSTRUCCIONES PARA EL CUIDADO EN EL HOGAR  Mantenga una buena higiene bucal. Esto mantiene la boca y las encas sanas. Utilice una pasta dental con flor para cepillarse los dientes dos veces por da y psese el hilo dental una vez por da.  Si el dentista recet un antibitico para tratar una infeccin, tmelo segn las indicaciones. No deje de tomar o usar el antibitico aunque la afeccin mejore.  Concurra a todas las visitas de control como se lo haya indicado el dentista. Esto es importante. Esto incluye todas las limpiezas. PREVENCIN Para prevenir caries:  Cepllese los dientes a la maana y a la noche con pasta dental con flor.  Realcese limpiezas dentales regulares.  Si tiene riesgo de caries, lvese la boca con un enjuague bucal recetado (clorhexidina) y aplquese flor de uso tpico en los dientes.  Beba agua fluorada regularmente.  Beba agua en lugar de bebidas azucaradas.  Consuma comidas y refrigerios saludables. SOLICITE ATENCIN MDICA SI:  Tiene sntomas de caries. Esta informacin no tiene como fin reemplazar el consejo del mdico. Asegrese de hacerle al mdico cualquier pregunta que tenga. Document Released: 09/04/2005 Document Revised: 12/27/2015 Document Reviewed: 03/17/2016 Elsevier Interactive Patient Education  2017 Elsevier Inc.  

## 2016-12-18 ENCOUNTER — Ambulatory Visit: Payer: Self-pay | Attending: Family Medicine | Admitting: Family Medicine

## 2016-12-18 VITALS — BP 96/64 | HR 77 | Temp 98.1°F | Resp 18 | Ht 63.0 in | Wt 175.6 lb

## 2016-12-18 DIAGNOSIS — Z79899 Other long term (current) drug therapy: Secondary | ICD-10-CM | POA: Insufficient documentation

## 2016-12-18 DIAGNOSIS — Z7689 Persons encountering health services in other specified circumstances: Secondary | ICD-10-CM | POA: Insufficient documentation

## 2016-12-18 DIAGNOSIS — B9789 Other viral agents as the cause of diseases classified elsewhere: Secondary | ICD-10-CM

## 2016-12-18 DIAGNOSIS — J029 Acute pharyngitis, unspecified: Secondary | ICD-10-CM | POA: Insufficient documentation

## 2016-12-18 DIAGNOSIS — J069 Acute upper respiratory infection, unspecified: Secondary | ICD-10-CM

## 2016-12-18 MED ORDER — FLUTICASONE PROPIONATE 50 MCG/ACT NA SUSP: 2.0000 | Freq: Every day | NASAL | 0 refills | Status: DC

## 2016-12-18 MED ORDER — LORATADINE 10 MG PO TABS: 10.0000 mg | ORAL_TABLET | Freq: Every day | ORAL | 0 refills | Status: DC

## 2016-12-18 MED ORDER — GUAIFENESIN-DM 100-10 MG/5ML PO SYRP: 5.0000 mL | ORAL_SOLUTION | ORAL | 0 refills | Status: DC | PRN

## 2016-12-18 NOTE — Patient Instructions (Signed)
Upper Respiratory Infection, Adult Most upper respiratory infections (URIs) are a viral infection of the air passages leading to the lungs. A URI affects the nose, throat, and upper air passages. The most common type of URI is nasopharyngitis and is typically referred to as "the common cold." URIs run their course and usually go away on their own. Most of the time, a URI does not require medical attention, but sometimes a bacterial infection in the upper airways can follow a viral infection. This is called a secondary infection. Sinus and middle ear infections are common types of secondary upper respiratory infections. Bacterial pneumonia can also complicate a URI. A URI can worsen asthma and chronic obstructive pulmonary disease (COPD). Sometimes, these complications can require emergency medical care and may be life threatening. What are the causes? Almost all URIs are caused by viruses. A virus is a type of germ and can spread from one person to another. What increases the risk? You may be at risk for a URI if:  You smoke.  You have chronic heart or lung disease.  You have a weakened defense (immune) system.  You are very young or very old.  You have nasal allergies or asthma.  You work in crowded or poorly ventilated areas.  You work in health care facilities or schools.  What are the signs or symptoms? Symptoms typically develop 2-3 days after you come in contact with a cold virus. Most viral URIs last 7-10 days. However, viral URIs from the influenza virus (flu virus) can last 14-18 days and are typically more severe. Symptoms may include:  Runny or stuffy (congested) nose.  Sneezing.  Cough.  Sore throat.  Headache.  Fatigue.  Fever.  Loss of appetite.  Pain in your forehead, behind your eyes, and over your cheekbones (sinus pain).  Muscle aches.  How is this diagnosed? Your health care provider may diagnose a URI by:  Physical exam.  Tests to check that your  symptoms are not due to another condition such as: ? Strep throat. ? Sinusitis. ? Pneumonia. ? Asthma.  How is this treated? A URI goes away on its own with time. It cannot be cured with medicines, but medicines may be prescribed or recommended to relieve symptoms. Medicines may help:  Reduce your fever.  Reduce your cough.  Relieve nasal congestion.  Follow these instructions at home:  Take medicines only as directed by your health care provider.  Gargle warm saltwater or take cough drops to comfort your throat as directed by your health care provider.  Use a warm mist humidifier or inhale steam from a shower to increase air moisture. This may make it easier to breathe.  Drink enough fluid to keep your urine clear or pale yellow.  Eat soups and other clear broths and maintain good nutrition.  Rest as needed.  Return to work when your temperature has returned to normal or as your health care provider advises. You may need to stay home longer to avoid infecting others. You can also use a face mask and careful hand washing to prevent spread of the virus.  Increase the usage of your inhaler if you have asthma.  Do not use any tobacco products, including cigarettes, chewing tobacco, or electronic cigarettes. If you need help quitting, ask your health care provider. How is this prevented? The best way to protect yourself from getting a cold is to practice good hygiene.  Avoid oral or hand contact with people with cold symptoms.  Wash your   hands often if contact occurs.  There is no clear evidence that vitamin C, vitamin E, echinacea, or exercise reduces the chance of developing a cold. However, it is always recommended to get plenty of rest, exercise, and practice good nutrition. Contact a health care provider if:  You are getting worse rather than better.  Your symptoms are not controlled by medicine.  You have chills.  You have worsening shortness of breath.  You have  brown or red mucus.  You have yellow or brown nasal discharge.  You have pain in your face, especially when you bend forward.  You have a fever.  You have swollen neck glands.  You have pain while swallowing.  You have white areas in the back of your throat. Get help right away if:  You have severe or persistent: ? Headache. ? Ear pain. ? Sinus pain. ? Chest pain.  You have chronic lung disease and any of the following: ? Wheezing. ? Prolonged cough. ? Coughing up blood. ? A change in your usual mucus.  You have a stiff neck.  You have changes in your: ? Vision. ? Hearing. ? Thinking. ? Mood. This information is not intended to replace advice given to you by your health care provider. Make sure you discuss any questions you have with your health care provider. Document Released: 02/28/2001 Document Revised: 05/07/2016 Document Reviewed: 12/10/2013 Elsevier Interactive Patient Education  2017 Elsevier Inc.  

## 2016-12-18 NOTE — Progress Notes (Deleted)
  Subjective:   Patient ID: Caitlyn Clark, female    DOB: 1991/06/25, 26 y.o.   MRN: [id number]  No chief complaint on file.  HPI Caitlyn Clark 26 y.o. female presents with    Symptoms started sat.  Headaches, sore throat, Sunday coughing started  Phelgm  is green, little amount  No runny nose  No sick contacts Itchy eyes  No allegies, asthma, no smoking  Advil for symptoms No sinus pressure    Past Medical History:  Diagnosis Date  . No pertinent past medical history     Past Surgical History:  Procedure Laterality Date  . CESAREAN SECTION      Family History  Problem Relation Age of Onset  . Diabetes Father   . Anesthesia problems Neg Hx     Social History   Social History  . Marital status: Married    Spouse name: N/A  . Number of children: N/A  . Years of education: N/A   Occupational History  . Not on file.   Social History Main Topics  . Smoking status: Never Smoker  . Smokeless tobacco: Never Used  . Alcohol use No  . Drug use: No  . Sexual activity: Yes   Other Topics Concern  . Not on file   Social History Narrative  . No narrative on file    Outpatient Medications Prior to Visit  Medication Sig Dispense Refill  . benzocaine (ORAJEL) 10 % mucosal gel Use as directed 1 application in the mouth or throat as needed for mouth pain. 5.3 g 0  . ibuprofen (ADVIL,MOTRIN) 600 MG tablet Take 1 tablet (600 mg total) by mouth every 8 (eight) hours as needed (Take with food). 30 tablet 0  . prenatal vitamin w/FE, FA (PRENATAL 1 + 1) 27-1 MG TABS Take 1 tablet by mouth daily.       No facility-administered medications prior to visit.     No Known Allergies  ROS     Objective:    Physical Exam  There were no vitals taken for this visit. Wt Readings from Last 3 Encounters:  11/29/16 173 lb 12.8 oz (78.8 kg)  01/29/12 132 lb 11.2 oz (60.2 kg)  12/28/11 146 lb (66.2 kg)    Immunization History  Administered Date(s)  Administered  . Influenza Split 10/25/2011  . Tdap 11/29/2011    Diabetic Foot Exam - Simple   No data filed      No results found for: TSH Lab Results  Component Value Date   WBC 9.0 12/28/2011   HGB 12.1 12/28/2011   HCT 37.1 12/28/2011   MCV 87.9 12/28/2011   PLT 276 12/28/2011   No results found for: NA, K, CHLORIDE, CO2, GLUCOSE, BUN, CREATININE, BILITOT, ALKPHOS, AST, ALT, PROT, ALBUMIN, CALCIUM, ANIONGAP, EGFR, GFR No results found for: CHOL No results found for: HDL No results found for: LDLCALC No results found for: TRIG No results found for: CHOLHDL No results found for: HGBA1C     Assessment & Plan:   Problem List Items Addressed This Visit    None      I am having Ms. Trinidad-Mondono maintain her (prenatal vitamin w/FE, FA), ibuprofen, and benzocaine.  No orders of the defined types were placed in this encounter.    Fredia Beets, FNP   `

## 2016-12-18 NOTE — Progress Notes (Signed)
Subjective:  Patient ID: Caitlyn Clark, female    DOB: [birth year]  Age: 26 y.o. MRN: [id number]  CC: Establish Care   HPI Caitlyn Clark presents for   URI symptoms: Symptoms started Saturday. Reports headaches, sore throat, itchy eyes. Reports cough that began on Sunday. Cough is productive with scant amount of green sputum. Denies any fevers, chills, bodyaches, sinus pressure, or rhinorrhea. Denies any sick contacts or history of seasonal allergies or asthma.  Dental referral f/u:  Referral placed at last office visit. Patient encouraged to apply for orange card to complete process. Denies any swelling of the face, tongue, or throat. Denies any drainage. Reports having medication to take for pain.  Outpatient Medications Prior to Visit  Medication Sig Dispense Refill  . benzocaine (ORAJEL) 10 % mucosal gel Use as directed 1 application in the mouth or throat as needed for mouth pain. (Patient not taking: Reported on 12/18/2016) 5.3 g 0  . ibuprofen (ADVIL,MOTRIN) 600 MG tablet Take 1 tablet (600 mg total) by mouth every 8 (eight) hours as needed (Take with food). (Patient not taking: Reported on 12/18/2016) 30 tablet 0  . prenatal vitamin w/FE, FA (PRENATAL 1 + 1) 27-1 MG TABS Take 1 tablet by mouth daily.       No facility-administered medications prior to visit.     ROS Review of Systems  Constitutional: Negative.   HENT: Positive for sore throat.   Eyes: Positive for itching.  Respiratory: Positive for cough.   Cardiovascular: Negative.   Gastrointestinal: Negative.     Objective:  BP 96/64 (BP Location: Left Arm, Patient Position: Sitting, Cuff Size: Normal)   Pulse 77   Temp 98.1 F (36.7 C) (Oral)   Resp 18   Ht  (1.6 m)   Wt 175 lb 9.6 oz (79.7 kg)   SpO2 98%   BMI 31.11 kg/m   BP/Weight 12/18/2016 11/29/2016 01/29/2012  Systolic BP 96 95 95  Diastolic BP 64 60 61  Wt. (Lbs) 175.6 173.8 132.7  BMI 31.11 30.79 22.77    Physical Exam    Constitutional: She appears well-developed and well-nourished. She appears ill. No distress.  HENT:  Nose: Mucosal edema and rhinorrhea present.  Mouth/Throat: Uvula is midline, oropharynx is clear and moist and mucous membranes are normal. No oropharyngeal exudate or posterior oropharyngeal erythema.  Eyes: Conjunctivae are normal. Pupils are equal, round, and reactive to light.  Neck: Normal range of motion. Neck supple.  Cardiovascular: Normal rate, regular rhythm, normal heart sounds and intact distal pulses.   Pulmonary/Chest: Effort normal and breath sounds normal.  Abdominal: Soft. Bowel sounds are normal.  Lymphadenopathy:    She has no cervical adenopathy.  Skin: Skin is warm and dry. She is not diaphoretic.  Nursing note and vitals reviewed.   Assessment & Plan:   Problem List Items Addressed This Visit    None    Visit Diagnoses    Viral upper respiratory tract infection    -  Primary   Relevant Medications   fluticasone (FLONASE) 50 MCG/ACT nasal spray   guaiFENesin-dextromethorphan (ROBITUSSIN DM) 100-10 MG/5ML syrup   loratadine (CLARITIN) 10 MG tablet      Meds ordered this encounter  Medications  . fluticasone (FLONASE) 50 MCG/ACT nasal spray    Sig: Place 2 sprays into both nostrils daily.    Dispense:  16 g    Refill:  0    Order Specific Question:   Supervising Provider    Answer:   Hyman Hopes,  OLUGBEMIGA E [[id number]]  . guaiFENesin-dextromethorphan (ROBITUSSIN DM) 100-10 MG/5ML syrup    Sig: Take 5 mLs by mouth every 4 (four) hours as needed for cough.    Dispense:  236 mL    Refill:  0    Order Specific Question:   Supervising Provider    Answer:   Quentin Angst [id number]  . loratadine (CLARITIN) 10 MG tablet    Sig: Take 1 tablet (10 mg total) by mouth daily.    Dispense:  30 tablet    Refill:  0    Order Specific Question:   Supervising Provider    Answer:   Quentin Angst [id number]    Follow-up: Return if symptoms worsen or fail to  improve. Return in about 2 weeks (around 01/01/2017), for physical exam .   Lizbeth Bark FNP

## 2016-12-18 NOTE — Progress Notes (Signed)
Patient is here for physical  Patient stated that she has a cold & a cough since saturday  No fever No chills  Patient has not eaten for today  Patient is not taking any current medication

## 2016-12-27 ENCOUNTER — Ambulatory Visit: Payer: Self-pay | Attending: Family Medicine

## 2017-01-03 ENCOUNTER — Ambulatory Visit: Payer: Self-pay | Attending: Family Medicine | Admitting: Family Medicine

## 2017-01-03 ENCOUNTER — Encounter: Payer: Self-pay | Admitting: Family Medicine

## 2017-01-03 VITALS — BP 98/62 | HR 73 | Temp 98.2°F | Resp 18 | Ht 63.0 in | Wt 172.0 lb

## 2017-01-03 DIAGNOSIS — R1030 Lower abdominal pain, unspecified: Secondary | ICD-10-CM | POA: Insufficient documentation

## 2017-01-03 DIAGNOSIS — Z9889 Other specified postprocedural states: Secondary | ICD-10-CM | POA: Insufficient documentation

## 2017-01-03 DIAGNOSIS — Z833 Family history of diabetes mellitus: Secondary | ICD-10-CM | POA: Insufficient documentation

## 2017-01-03 DIAGNOSIS — Z0000 Encounter for general adult medical examination without abnormal findings: Secondary | ICD-10-CM

## 2017-01-03 DIAGNOSIS — Z0001 Encounter for general adult medical examination with abnormal findings: Secondary | ICD-10-CM | POA: Insufficient documentation

## 2017-01-03 DIAGNOSIS — Z79899 Other long term (current) drug therapy: Secondary | ICD-10-CM | POA: Insufficient documentation

## 2017-01-03 DIAGNOSIS — R8290 Unspecified abnormal findings in urine: Secondary | ICD-10-CM

## 2017-01-03 DIAGNOSIS — R1084 Generalized abdominal pain: Secondary | ICD-10-CM

## 2017-01-03 DIAGNOSIS — N3001 Acute cystitis with hematuria: Secondary | ICD-10-CM | POA: Insufficient documentation

## 2017-01-03 LAB — POCT URINALYSIS DIPSTICK
BILIRUBIN UA: NEGATIVE
Glucose, UA: NEGATIVE
KETONES UA: NEGATIVE
Nitrite, UA: POSITIVE
Protein, UA: 30
Spec Grav, UA: 1.025 (ref 1.010–1.025)
Urobilinogen, UA: 0.2 E.U./dL
pH, UA: 5.5 (ref 5.0–8.0)

## 2017-01-03 LAB — POCT URINE PREGNANCY: Preg Test, Ur: NEGATIVE

## 2017-01-03 MED ORDER — IBUPROFEN 600 MG PO TABS: 600.0000 mg | ORAL_TABLET | Freq: Three times a day (TID) | ORAL | 0 refills | Status: DC | PRN

## 2017-01-03 MED ORDER — NITROFURANTOIN MONOHYD MACRO 100 MG PO CAPS: 100.0000 mg | ORAL_CAPSULE | Freq: Two times a day (BID) | ORAL | 0 refills | Status: DC

## 2017-01-03 MED FILL — NITROFURANTOIN MONO-MCR 100: 100 | 5 days supply | Qty: 10 | Fill #0

## 2017-01-03 MED FILL — IBUPROFEN 600 MG TABLET: 600 | 10 days supply | Qty: 30 | Fill #0

## 2017-01-03 NOTE — Progress Notes (Signed)
Subjective:   Patient ID: Caitlyn Clark, female    DOB: 07-14-1991, 26 y.o.   MRN: [id number]  Chief Complaint  Patient presents with  . Annual Exam   HPI Caitlyn Clark 26 y.o. female presents with   Annual physical:Presents for annual physical examination. She denies any constitutional symptoms. She denies any chest pain, shortness of breath, or swelling in the bilateral lower extremities. She does report history of lower abdominal pain with foul-smelling urine for one year. She denies any hematuria, cloudy urine vaginal discharge, or vaginal lesions. She is a nonsmoker. She does not drink. She denies any family history of high blood pressure, diabetes, or cancer. Denies symptoms of all other pertinent systems.   Past Medical History:  Diagnosis Date  . No pertinent past medical history     Past Surgical History:  Procedure Laterality Date  . CESAREAN SECTION      Family History  Problem Relation Age of Onset  . Diabetes Father   . Anesthesia problems Neg Hx     Social History   Social History  . Marital status: Married    Spouse name: N/A  . Number of children: N/A  . Years of education: N/A   Occupational History  . Not on file.   Social History Main Topics  . Smoking status: Never Smoker  . Smokeless tobacco: Never Used  . Alcohol use No  . Drug use: No  . Sexual activity: Yes   Other Topics Concern  . Not on file   Social History Narrative  . No narrative on file    Outpatient Medications Prior to Visit  Medication Sig Dispense Refill  . benzocaine (ORAJEL) 10 % mucosal gel Use as directed 1 application in the mouth or throat as needed for mouth pain. (Patient not taking: Reported on 12/18/2016) 5.3 g 0  . fluticasone (FLONASE) 50 MCG/ACT nasal spray Place 2 sprays into both nostrils daily. 16 g 0  . guaiFENesin-dextromethorphan (ROBITUSSIN DM) 100-10 MG/5ML syrup Take 5 mLs by mouth every 4 (four) hours as needed for cough. 236 mL 0    . loratadine (CLARITIN) 10 MG tablet Take 1 tablet (10 mg total) by mouth daily. 30 tablet 0  . prenatal vitamin w/FE, FA (PRENATAL 1 + 1) 27-1 MG TABS Take 1 tablet by mouth daily.      Marland Kitchen ibuprofen (ADVIL,MOTRIN) 600 MG tablet Take 1 tablet (600 mg total) by mouth every 8 (eight) hours as needed (Take with food). (Patient not taking: Reported on 12/18/2016) 30 tablet 0   No facility-administered medications prior to visit.     No Known Allergies  Review of Systems  Constitutional: Negative.   HENT: Negative.   Respiratory: Negative.   Cardiovascular: Negative.   Gastrointestinal: Positive for abdominal pain.  Musculoskeletal: Negative.   Skin: Negative.   Neurological: Negative.   Psychiatric/Behavioral: Negative.    Objective:    Physical Exam  Constitutional: She is oriented to person, place, and time. She appears well-developed and well-nourished.  HENT:  Head: Normocephalic and atraumatic.  Right Ear: External ear normal.  Left Ear: External ear normal.  Nose: Nose normal.  Mouth/Throat: Oropharynx is clear and moist.  Eyes: Conjunctivae and EOM are normal. Pupils are equal, round, and reactive to light.  Neck: Normal range of motion. Neck supple.  Cardiovascular: Normal rate, regular rhythm, normal heart sounds and intact distal pulses.   Pulmonary/Chest: Effort normal and breath sounds normal.  Abdominal: Soft. Bowel sounds are normal. There is tenderness.  Musculoskeletal: Normal range of motion.  Neurological: She is alert and oriented to person, place, and time. She has normal reflexes.  Skin: Skin is warm and dry.  Psychiatric: She has a normal mood and affect. Her behavior is normal. Thought content normal.  Nursing note and vitals reviewed.   BP 98/62 (BP Location: Left Arm, Patient Position: Sitting, Cuff Size: Normal)   Pulse 73   Temp 98.2 F (36.8 C) (Oral)   Resp 18   Ht '5\' 3"'  (1.6 m)   Wt 172 lb (78 kg)   SpO2 98%   BMI 30.47 kg/m  Wt Readings  from Last 3 Encounters:  01/03/17 172 lb (78 kg)  12/18/16 175 lb 9.6 oz (79.7 kg)  11/29/16 173 lb 12.8 oz (78.8 kg)    Immunization History  Administered Date(s) Administered  . Influenza Split 10/25/2011  . Tdap 11/29/2011    Diabetic Foot Exam - Simple   No data filed      Lab Results  Component Value Date   TSH 1.930 01/03/2017   Lab Results  Component Value Date   WBC 7.0 01/03/2017   HGB 12.1 12/28/2011   HCT 42.4 01/03/2017   MCV 93 01/03/2017   PLT 327 01/03/2017   Lab Results  Component Value Date   NA 140 01/03/2017   K 4.3 01/03/2017   CO2 25 01/03/2017   GLUCOSE 92 01/03/2017   BUN 7 01/03/2017   CREATININE 0.52 (L) 01/03/2017   BILITOT 0.4 01/03/2017   ALKPHOS 90 01/03/2017   AST 26 01/03/2017   ALT 33 (H) 01/03/2017   PROT 7.9 01/03/2017   ALBUMIN 4.6 01/03/2017   CALCIUM 9.5 01/03/2017   Lab Results  Component Value Date   CHOL 171 01/03/2017   Lab Results  Component Value Date   HDL 27 (L) 01/03/2017   Lab Results  Component Value Date   LDLCALC 97 01/03/2017   Lab Results  Component Value Date   TRIG 236 (H) 01/03/2017   Lab Results  Component Value Date   CHOLHDL 6.3 (H) 01/03/2017   Lab Results  Component Value Date   HGBA1C 5.3 01/03/2017       Assessment & Plan:   Problem List Items Addressed This Visit    None    Visit Diagnoses    Annual physical exam    -  Primary   Relevant Orders   CMP14+EGFR (Completed)   CBC with Differential (Completed)   Hemoglobin A1c (Completed)   TSH (Completed)   Vitamin D, 25-hydroxy (Completed)   Lipid Panel (Completed)   Acute cystitis with hematuria       Relevant Medications   nitrofurantoin, macrocrystal-monohydrate, (MACROBID) 100 MG capsule   Generalized abdominal cramps       Relevant Medications   ibuprofen (ADVIL,MOTRIN) 600 MG tablet   Other Relevant Orders   CMP14+EGFR (Completed)   CBC with Differential (Completed)   Lipid Panel (Completed)   Urinalysis  Dipstick (Completed)   POCT urine pregnancy (Completed)   Abnormal urine odor       Relevant Medications   ibuprofen (ADVIL,MOTRIN) 600 MG tablet   Other Relevant Orders   Urinalysis Dipstick (Completed)      Meds ordered this encounter  Medications  . ibuprofen (ADVIL,MOTRIN) 600 MG tablet    Sig: Take 1 tablet (600 mg total) by mouth every 8 (eight) hours as needed (Take with food).    Dispense:  30 tablet    Refill:  0  Order Specific Question:   Supervising Provider    Answer:   Tresa Garter [id number]  . nitrofurantoin, macrocrystal-monohydrate, (MACROBID) 100 MG capsule    Sig: Take 1 capsule (100 mg total) by mouth 2 (two) times daily.    Dispense:  10 capsule    Refill:  0    Order Specific Question:   Supervising Provider    Answer:   Tresa Garter [[id number]]      Fredia Beets, FNP

## 2017-01-03 NOTE — Progress Notes (Signed)
Patient is here for physical  Patient is not taking any current medication  Patient has eaten for today  Patient denies pain for today

## 2017-01-04 LAB — CMP14+EGFR
ALBUMIN: 4.6 g/dL (ref 3.5–5.5)
ALK PHOS: 90 IU/L (ref 39–117)
ALT: 33 IU/L — ABNORMAL HIGH (ref 0–32)
AST: 26 IU/L (ref 0–40)
Albumin/Globulin Ratio: 1.4 (ref 1.2–2.2)
BUN/Creatinine Ratio: 13 (ref 9–23)
BUN: 7 mg/dL (ref 6–20)
Bilirubin Total: 0.4 mg/dL (ref 0.0–1.2)
CALCIUM: 9.5 mg/dL (ref 8.7–10.2)
CO2: 25 mmol/L (ref 18–29)
CREATININE: 0.52 mg/dL — AB (ref 0.57–1.00)
Chloride: 101 mmol/L (ref 96–106)
GFR calc Af Amer: 152 mL/min/{1.73_m2} (ref >59)
GFR, EST NON AFRICAN AMERICAN: 132 mL/min/{1.73_m2} (ref >59)
GLUCOSE: 92 mg/dL (ref 65–99)
Globulin, Total: 3.3 g/dL (ref 1.5–4.5)
Potassium: 4.3 mmol/L (ref 3.5–5.2)
Sodium: 140 mmol/L (ref 134–144)
Total Protein: 7.9 g/dL (ref 6.0–8.5)

## 2017-01-04 LAB — HEMOGLOBIN A1C
ESTIMATED AVERAGE GLUCOSE: 105 mg/dL
Hgb A1c MFr Bld: 5.3 % (ref 4.8–5.6)

## 2017-01-04 LAB — CBC WITH DIFFERENTIAL/PLATELET
BASOS: 0 %
Basophils Absolute: 0 10*3/uL (ref 0.0–0.2)
EOS (ABSOLUTE): 0.2 10*3/uL (ref 0.0–0.4)
EOS: 4 %
HEMATOCRIT: 42.4 % (ref 34.0–46.6)
HEMOGLOBIN: 13.9 g/dL (ref 11.1–15.9)
IMMATURE GRANULOCYTES: 0 %
Immature Grans (Abs): 0 10*3/uL (ref 0.0–0.1)
Lymphocytes Absolute: 1.8 10*3/uL (ref 0.7–3.1)
Lymphs: 26 %
MCH: 30.5 pg (ref 26.6–33.0)
MCHC: 32.8 g/dL (ref 31.5–35.7)
MCV: 93 fL (ref 79–97)
MONOCYTES: 5 %
Monocytes Absolute: 0.4 10*3/uL (ref 0.1–0.9)
NEUTROS PCT: 65 %
Neutrophils Absolute: 4.5 10*3/uL (ref 1.4–7.0)
Platelets: 327 10*3/uL (ref 150–379)
RBC: 4.56 x10E6/uL (ref 3.77–5.28)
RDW: 13.5 % (ref 12.3–15.4)
WBC: 7 10*3/uL (ref 3.4–10.8)

## 2017-01-04 LAB — LIPID PANEL
CHOLESTEROL TOTAL: 171 mg/dL (ref 100–199)
Chol/HDL Ratio: 6.3 ratio — ABNORMAL HIGH (ref 0.0–4.4)
HDL: 27 mg/dL — ABNORMAL LOW (ref >39)
LDL Calculated: 97 mg/dL (ref 0–99)
TRIGLYCERIDES: 236 mg/dL — AB (ref 0–149)
VLDL Cholesterol Cal: 47 mg/dL — ABNORMAL HIGH (ref 5–40)

## 2017-01-04 LAB — TSH: TSH: 1.93 u[IU]/mL (ref 0.450–4.500)

## 2017-01-04 LAB — VITAMIN D 25 HYDROXY (VIT D DEFICIENCY, FRACTURES): VIT D 25 HYDROXY: 12.5 ng/mL — AB (ref 30.0–100.0)

## 2017-01-09 ENCOUNTER — Other Ambulatory Visit: Payer: Self-pay | Admitting: Family Medicine

## 2017-01-09 ENCOUNTER — Telehealth: Payer: Self-pay

## 2017-01-09 DIAGNOSIS — E559 Vitamin D deficiency, unspecified: Secondary | ICD-10-CM

## 2017-01-09 MED ORDER — VITAMIN D (ERGOCALCIFEROL) 1.25 MG (50000 UNIT) PO CAPS: 50000.0000 [IU] | ORAL_CAPSULE | ORAL | 0 refills | Status: AC

## 2017-01-09 MED FILL — VIT D2 1.25 MG (50,000 UNIT: 1.25 MG | 60 days supply | Qty: 8 | Fill #0

## 2017-01-09 NOTE — Telephone Encounter (Signed)
-----   Message from Lizbeth Bark, FNP sent at 01/09/2017  5:27 AM EDT ----- Kidney function normal Liver function normal Hgba1c is normal. Hgba1c screen for diabetes. You do not have diabetes. Thyroid function normal Labs normal. Lipid levels were elevated. This can increase your risk of heart disease overtime. Start eating a diet low in saturated fat. Limit your intake of fried foods, red meats, and whole milk. Begin exercising at least 3-5 times per week for 30 minutes. Vitamin D level was low. Vitamin D helps to keep bones strong. You were prescribed ergocalciferol (capsules) to increase your vitamin-d level. Once finished start taking OTC vitamin d supplement with 800 international units (IU) of vitamin-d per day. Recommend recheck in 4 months.

## 2017-01-09 NOTE — Telephone Encounter (Signed)
CMA call patient regarding lab results   Patient Verify DOB   Patient was aware and understood  

## 2017-05-29 ENCOUNTER — Ambulatory Visit (HOSPITAL_COMMUNITY)
Admission: EM | Admit: 2017-05-29 | Discharge: 2017-05-29 | Disposition: A | Discharge status: Home / Self Care | Payer: Self-pay | Attending: Emergency Medicine | Admitting: Emergency Medicine

## 2017-05-29 ENCOUNTER — Encounter (HOSPITAL_COMMUNITY): Payer: Self-pay | Admitting: *Deleted

## 2017-05-29 DIAGNOSIS — R1084 Generalized abdominal pain: Secondary | ICD-10-CM

## 2017-05-29 DIAGNOSIS — R8290 Unspecified abnormal findings in urine: Secondary | ICD-10-CM

## 2017-05-29 DIAGNOSIS — K047 Periapical abscess without sinus: Secondary | ICD-10-CM

## 2017-05-29 DIAGNOSIS — K0889 Other specified disorders of teeth and supporting structures: Secondary | ICD-10-CM

## 2017-05-29 MED ORDER — PENICILLIN V POTASSIUM 500 MG PO TABS: 500.0000 mg | ORAL_TABLET | Freq: Four times a day (QID) | ORAL | 0 refills | Status: AC

## 2017-05-29 MED ORDER — IBUPROFEN 600 MG PO TABS: 600.0000 mg | ORAL_TABLET | Freq: Four times a day (QID) | ORAL | 0 refills | Status: DC | PRN

## 2017-05-29 MED FILL — IBUPROFEN 600 MG TABS: 600 | 7 days supply | Qty: 30 | Fill #0

## 2017-05-29 MED FILL — PENICILLIN VK 500 MG TABLET: 500 | 7 days supply | Qty: 28 | Fill #0

## 2017-05-29 NOTE — ED Provider Notes (Signed)
HPI  SUBJECTIVE:  Caitlyn Clark is a 26 y.o. female who presents with throbbing intermittent minutes long left upper dental pain, foul smelling purulent drainge starting yesterday. Denies sensitivity to temperature or air. States that she had a tooth extracted on Friday. She called the dentist who did the extraction and was told that she couldn't be seen today. She reports a left-sided headache. No fevers, facial swelling. There are no aggravating or alleviating factors. She has not tried anything for this. She is currently not on any antibiotics. Past medical history negative for diabetes, hypertension. LMP: 8/20. Denies possibility of being pregnant. NUU:[id number]:Hairston, Oren BeckmannMandesia R, FNP  All history obtained through a translator.  Past Medical History:  Diagnosis Date  . No pertinent past medical history     Past Surgical History:  Procedure Laterality Date  . CESAREAN SECTION      Family History  Problem Relation Age of Onset  . Diabetes Father   . Anesthesia problems Neg Hx     Social History  Substance Use Topics  . Smoking status: Never Smoker  . Smokeless tobacco: Never Used  . Alcohol use No    No current facility-administered medications for this encounter.   Current Outpatient Prescriptions:  .  benzocaine (ORAJEL) 10 % mucosal gel, Use as directed 1 application in the mouth or throat as needed for mouth pain. (Patient not taking: Reported on 12/18/2016), Disp: 5.3 g, Rfl: 0 .  fluticasone (FLONASE) 50 MCG/ACT nasal spray, Place 2 sprays into both nostrils daily., Disp: 16 g, Rfl: 0 .  ibuprofen (ADVIL,MOTRIN) 600 MG tablet, Take 1 tablet (600 mg total) by mouth every 6 (six) hours as needed (Take with food)., Disp: 30 tablet, Rfl: 0 .  loratadine (CLARITIN) 10 MG tablet, Take 1 tablet (10 mg total) by mouth daily., Disp: 30 tablet, Rfl: 0 .  penicillin v potassium (VEETID) 500 MG tablet, Take 1 tablet (500 mg total) by mouth 4 (four) times daily., Disp: 28 tablet,  Rfl: 0 .  prenatal vitamin w/FE, FA (PRENATAL 1 + 1) 27-1 MG TABS, Take 1 tablet by mouth daily.  , Disp: , Rfl:   No Known Allergies   ROS  As noted in HPI.   Physical Exam  BP 132/60 (BP Location: Right Arm)   Pulse 78   Temp 98.6 F (37 C) (Oral)   Resp 18   LMP 04/27/2017   SpO2 100%   Constitutional: Well developed, well nourished, no acute distress Eyes:  EOMI, conjunctiva normal bilaterally HENT: Normocephalic, atraumatic,mucus membranes moist. No trismus. Tooth #16 wisdom tooth absent,. No swelling of gum, no appreciable drainage.  + gingival tenderness Lymph: no cervical LN Respiratory: Normal inspiratory effort Cardiovascular: Normal rate GI: nondistended skin: No rash, skin intact Musculoskeletal: no deformities Neurologic: Alert & oriented x 3, no focal neuro deficits Psychiatric: Speech and behavior appropriate   ED Course   Medications - No data to display  No orders of the defined types were placed in this encounter.   No results found for this or any previous visit (from the past 24 hour(s)). No results found.  ED Clinical Impression  Pain, dental  Dental infection  Abnormal urine odor - Plan: ibuprofen (ADVIL,MOTRIN) 600 MG tablet  Generalized abdominal cramps - Plan: ibuprofen (ADVIL,MOTRIN) 600 MG tablet   ED Assessment/Plan  Presentation most consistent with postsurgical infection. There does not appear to be an abscess or empty socket at this time. Home with penicillin, ibuprofen 600 mg combined with 1 g of  Tylenol 3-4 times a day, salt water and Listerine rinses, she will need to follow-up with her dentist in several days. Patient declined a prescription of narcotics. Using the translator, Discussed MDM, plan and followup with patient . Discussed sn/sx that should prompt return to the ED. Patient agrees with plan.   Meds ordered this encounter  Medications  . ibuprofen (ADVIL,MOTRIN) 600 MG tablet    Sig: Take 1 tablet (600 mg  total) by mouth every 6 (six) hours as needed (Take with food).    Dispense:  30 tablet    Refill:  0  . penicillin v potassium (VEETID) 500 MG tablet    Sig: Take 1 tablet (500 mg total) by mouth 4 (four) times daily.    Dispense:  28 tablet    Refill:  0    *This clinic note was created using Scientist, clinical (histocompatibility and immunogenetics). Therefore, there may be occasional mistakes despite careful proofreading.  ?   Domenick Gong, MD 05/29/17 1501

## 2017-05-29 NOTE — ED Triage Notes (Signed)
Pt  Has  Pain    And   Swelling     Of      l  Upper   Side  Of  Face       Pt  Has   A  Toothache     And   Has   An   appt  With a   Dentist  In  Oct

## 2017-05-29 NOTE — Discharge Instructions (Signed)
600 mg of ibuprofen with 1 gram of Tylenol 3-4 times a day as needed for pain. Salt water and Listerine rinses. Follow-up with your dentist who did the extraction in 2 or 3 days if you are not getting better.

## 2017-05-31 ENCOUNTER — Ambulatory Visit: Payer: Self-pay

## 2017-06-29 ENCOUNTER — Ambulatory Visit: Payer: Self-pay | Attending: Family Medicine | Admitting: Family Medicine

## 2017-06-29 VITALS — BP 96/61 | HR 66 | Temp 98.3°F | Resp 18 | Ht 62.0 in | Wt 173.0 lb

## 2017-06-29 DIAGNOSIS — E559 Vitamin D deficiency, unspecified: Secondary | ICD-10-CM | POA: Insufficient documentation

## 2017-06-29 DIAGNOSIS — Z79899 Other long term (current) drug therapy: Secondary | ICD-10-CM | POA: Insufficient documentation

## 2017-06-29 DIAGNOSIS — Z13220 Encounter for screening for lipoid disorders: Secondary | ICD-10-CM

## 2017-06-29 DIAGNOSIS — O219 Vomiting of pregnancy, unspecified: Secondary | ICD-10-CM

## 2017-06-29 DIAGNOSIS — R51 Headache: Secondary | ICD-10-CM

## 2017-06-29 DIAGNOSIS — Z791 Long term (current) use of non-steroidal anti-inflammatories (NSAID): Secondary | ICD-10-CM | POA: Insufficient documentation

## 2017-06-29 DIAGNOSIS — O26899 Other specified pregnancy related conditions, unspecified trimester: Secondary | ICD-10-CM | POA: Insufficient documentation

## 2017-06-29 DIAGNOSIS — Z23 Encounter for immunization: Secondary | ICD-10-CM

## 2017-06-29 DIAGNOSIS — R519 Headache, unspecified: Secondary | ICD-10-CM

## 2017-06-29 DIAGNOSIS — Z8639 Personal history of other endocrine, nutritional and metabolic disease: Secondary | ICD-10-CM

## 2017-06-29 MED ORDER — ONDANSETRON HCL 4 MG PO TABS: 4.0000 mg | ORAL_TABLET | Freq: Three times a day (TID) | ORAL | 0 refills | Status: DC | PRN

## 2017-06-29 MED ORDER — ONDANSETRON HCL 4 MG PO TABS: ORAL_TABLET | ORAL | 0 refills | Status: DC

## 2017-06-29 MED ORDER — ACETAMINOPHEN 500 MG PO TABS: 1000.0000 mg | ORAL_TABLET | Freq: Three times a day (TID) | ORAL | 0 refills | Status: DC | PRN

## 2017-06-29 MED FILL — ?ONDANSETRON HCL 4MG TABLET: 4 | 6 days supply | Qty: 20 | Fill #0

## 2017-06-29 NOTE — Patient Instructions (Signed)
Primer trimestre de embarazo (First Trimester of Pregnancy) El primer trimestre de embarazo se extiende desde la semana1 hasta el final de la semana12 (mes1 al mes3). Una semana despus de que un espermatozoide fecunda un vulo, este se implantar en la pared uterina. Este embrin comenzar a desarrollarse hasta convertirse en un beb. Sus genes y los de su pareja forman el beb. Los genes del varn determinan si ser un nio o una nia. Entre la semana6 y la8, se forman los ojos y el rostro, y los latidos del corazn pueden verse en la ecografa. Al final de las 12semanas, todos los rganos del beb estn formados. Ahora que est embarazada, querr hacer todo lo que est a su alcance para tener un beb sano. Dos de las cosas ms importantes son tener una buena atencin prenatal y seguir las indicaciones del mdico. La atencin prenatal incluye toda la asistencia mdica que usted recibe antes del nacimiento del beb. Esta ayudar a prevenir, detectar y tratar cualquier problema durante el embarazo y el parto. CAMBIOS EN EL ORGANISMO Su organismo atraviesa por muchos cambios durante el embarazo, y estos varan de una mujer a otra.  Al principio, puede aumentar o bajar algunos kilos.  Puede tener malestar estomacal (nuseas) y vomitar. Si no puede controlar los vmitos, llame al mdico.  Puede cansarse con facilidad.  Es posible que tenga dolores de cabeza que pueden aliviarse con los medicamentos que el mdico le permita tomar.  Puede orinar con mayor frecuencia. El dolor al orinar puede significar que usted tiene una infeccin de la vejiga.  Debido al embarazo, puede tener acidez estomacal.  Puede estar estreida, ya que ciertas hormonas enlentecen los movimientos de los msculos que empujan los desechos a travs de los intestinos.  Pueden aparecer hemorroides o abultarse e hincharse las venas (venas varicosas).  Las mamas pueden empezar a agrandarse y estar sensibles. Los pezones  pueden sobresalir ms, y el tejido que los rodea (areola) tornarse ms oscuro.  Las encas pueden sangrar y estar sensibles al cepillado y al hilo dental.  Pueden aparecer zonas oscuras o manchas (cloasma, mscara del embarazo) en el rostro que probablemente se atenuarn despus del nacimiento del beb.  Los perodos menstruales se interrumpirn.  Tal vez no tenga apetito.  Puede sentir un fuerte deseo de consumir ciertos alimentos.  Puede tener cambios a nivel emocional da a da, por ejemplo, por momentos puede estar emocionada por el embarazo y por otros preocuparse porque algo pueda salir mal con el embarazo o el beb.  Tendr sueos ms vvidos y extraos.  Tal vez haya cambios en el cabello que pueden incluir su engrosamiento, crecimiento rpido y cambios en la textura. A algunas mujeres tambin se les cae el cabello durante o despus del embarazo, o tienen el cabello seco o fino. Lo ms probable es que el cabello se le normalice despus del nacimiento del beb. QU DEBE ESPERAR EN LAS CONSULTAS PRENATALES Durante una visita prenatal de rutina:  La pesarn para asegurarse de que usted y el beb estn creciendo normalmente.  Le controlarn la presin arterial.  Le medirn el abdomen para controlar el desarrollo del beb.  Se escucharn los latidos cardacos a partir de la semana10 o la12 de embarazo, aproximadamente.  Se analizarn los resultados de los estudios solicitados en visitas anteriores. El mdico puede preguntarle:  Cmo se siente.  Si siente los movimientos del beb.  Si ha tenido sntomas anormales, como prdida de lquido, sangrado, dolores de cabeza intensos o clicos abdominales.    Si est consumiendo algn producto que contenga tabaco, como cigarrillos, tabaco de mascar y cigarrillos electrnicos.  Si tiene alguna pregunta. Otros estudios que pueden realizarse durante el primer trimestre incluyen lo siguiente:  Anlisis de sangre para determinar el tipo  de sangre y detectar la presencia de infecciones previas. Adems, se los usar para controlar si los niveles de hierro son bajos (anemia) y determinar los anticuerpos Rh. En una etapa ms avanzada del embarazo, se harn anlisis de sangre para saber si tiene diabetes, junto con otros estudios si surgen problemas.  Anlisis de orina para detectar infecciones, diabetes o protenas en la orina.  Una ecografa para confirmar que el beb crece y se desarrolla correctamente.  Una amniocentesis para diagnosticar posibles problemas genticos.  Estudios del feto para descartar espina bfida y sndrome de Down.  Es posible que necesite otras pruebas adicionales.  Prueba del VIH (virus de inmunodeficiencia humana). Los exmenes prenatales de rutina incluyen la prueba de deteccin del VIH, a menos que decida no realizrsela. INSTRUCCIONES PARA EL CUIDADO EN EL HOGAR Medicamentos:  Siga las indicaciones del mdico en relacin con el uso de medicamentos. Durante el embarazo, hay medicamentos que pueden tomarse y otros que no.  Tome las vitaminas prenatales como se le indic.  Si est estreida, tome un laxante suave, si el mdico lo autoriza. Dieta  Consuma alimentos balanceados. Elija alimentos variados, como carne o protenas de origen vegetal, pescado, leche y productos lcteos descremados, verduras, frutas y panes y cereales integrales. El mdico la ayudar a determinar la cantidad de peso que puede aumentar.  No coma carne cruda ni quesos sin cocinar. Estos elementos contienen bacterias que pueden causar defectos congnitos en el beb.  La ingesta diaria de cuatro o cinco comidas pequeas en lugar de tres comidas abundantes puede ayudar a aliviar las nuseas y los vmitos. Si empieza a tener nuseas, comer algunas galletas saladas puede ser de ayuda. Beber lquidos entre las comidas en lugar de tomarlos durante las comidas tambin puede ayudar a calmar las nuseas y los vmitos.  Si est  estreida, consuma alimentos con alto contenido de fibra, como verduras y frutas frescas, y cereales integrales. Beba suficiente lquido para mantener la orina clara o de color amarillo plido. Actividad y ejercicios  Haga ejercicio solamente como se lo haya indicado el mdico. El ejercicio la ayudar a: ? Controlar el peso. ? Mantenerse en forma. ? Estar preparada para el trabajo de parto y el parto.  Los dolores, los clicos en la parte baja del abdomen o los calambres en la cintura son un buen indicio de que debe dejar de hacer ejercicios. Consulte al mdico antes de seguir haciendo ejercicios normales.  Intente no estar de pie durante mucho tiempo. Mueva las piernas con frecuencia si debe estar de pie en un lugar durante mucho tiempo.  Evite levantar pesos excesivos.  Use zapatos de tacones bajos y mantenga una buena postura.  Puede seguir teniendo relaciones sexuales, excepto que el mdico le indique lo contrario. Alivio del dolor o las molestias  Use un sostn que le brinde buen soporte si siente dolor a la palpacin en las mamas.  Dese baos de asiento con agua tibia para aliviar el dolor o las molestias causadas por las hemorroides. Use crema antihemorroidal si el mdico se lo permite.  Descanse con las piernas elevadas si tiene calambres o dolor de cintura.  Si tiene venas varicosas en las piernas, use medias de descanso. Eleve los pies durante 15minutos, 3 o 4veces por   da. Limite la cantidad de sal en su dieta. Cuidados prenatales  Programe las visitas prenatales para la semana12 de embarazo. Generalmente se programan cada mes al principio y se hacen ms frecuentes en los 2 ltimos meses antes del parto.  Escriba sus preguntas. Llvelas cuando concurra a las visitas prenatales.  Concurra a todas las visitas prenatales como se lo haya indicado el mdico. Seguridad  Colquese el cinturn de seguridad cuando conduzca.  Haga una lista de los nmeros de telfono de  emergencia, que incluya los nmeros de telfono de familiares, amigos, el hospital y los departamentos de polica y bomberos. Consejos generales  Pdale al mdico que la derive a clases de educacin prenatal en su localidad. Debe comenzar a tomar las clases antes de entrar en el mes6 de embarazo.  Pida ayuda si tiene necesidades nutricionales o de asesoramiento durante el embarazo. El mdico puede aconsejarla o derivarla a especialistas para que la ayuden con diferentes necesidades.  No se d baos de inmersin en agua caliente, baos turcos ni saunas.  No se haga duchas vaginales ni use tampones o toallas higinicas perfumadas.  No mantenga las piernas cruzadas durante mucho tiempo.  Evite el contacto con las bandejas sanitarias de los gatos y la tierra que estos animales usan. Estos elementos contienen bacterias que pueden causar defectos congnitos al beb y la posible prdida del feto debido a un aborto espontneo o muerte fetal.  No fume, no consuma hierbas ni medicamentos que no hayan sido recetados por el mdico. Las sustancias qumicas que estos productos contienen afectan la formacin y el desarrollo del beb.  No consuma ningn producto que contenga tabaco, lo que incluye cigarrillos, tabaco de mascar y cigarrillos electrnicos. Si necesita ayuda para dejar de fumar, consulte al mdico. Puede recibir asesoramiento y otro tipo de recursos para dejar de fumar.  Programe una cita con el dentista. En su casa, lvese los dientes con un cepillo dental blando y psese el hilo dental con suavidad. SOLICITE ATENCIN MDICA SI:  Tiene mareos.  Siente clicos leves, presin en la pelvis o dolor persistente en el abdomen.  Tiene nuseas, vmitos o diarrea persistentes.  Tiene secrecin vaginal con mal olor.  Siente dolor al orinar.  Tiene el rostro, las manos, las piernas o los tobillos ms hinchados.  SOLICITE ATENCIN MDICA DE INMEDIATO SI:  Tiene fiebre.  Tiene una prdida de  lquido por la vagina.  Tiene sangrado o pequeas prdidas vaginales.  Siente dolor intenso o clicos en el abdomen.  Sube o baja de peso rpidamente.  Vomita sangre de color rojo brillante o material que parezca granos de caf.  Ha estado expuesta a la rubola y no ha sufrido la enfermedad.  Ha estado expuesta a la quinta enfermedad o a la varicela.  Tiene un dolor de cabeza intenso.  Le falta el aire.  Sufre cualquier tipo de traumatismo, por ejemplo, debido a una cada o un accidente automovilstico.  Esta informacin no tiene como fin reemplazar el consejo del mdico. Asegrese de hacerle al mdico cualquier pregunta que tenga. Document Released: 06/14/2005 Document Revised: 09/25/2014 Document Reviewed: 07/15/2013 Elsevier Interactive Patient Education  2017 [employer]  

## 2017-06-29 NOTE — Progress Notes (Signed)
Patient is here for cholesterol f/up   Patient stated that she is  [redacted] weeks pregnant Patient has already has an upcoming appt with the [facility]

## 2017-06-29 NOTE — Progress Notes (Signed)
Subjective:  Patient ID: Caitlyn Clark, female    DOB: 1991-02-10  Age: 26 y.o. MRN: [id number]  CC: Follow-up   HPI Caitlyn Clark presents for follow up. Interpreter services used [phone]. History of elevated lipids and vitamin d deficiency. She is currently pregnant. Reports upcoming appointment with ob gyn on Monday. She c/o N/V daily. Reports 4 to 5 times per day, poor oral intake.  Hasn't taken anything for symptoms. She also c/o headaches.    Outpatient Medications Prior to Visit  Medication Sig Dispense Refill  . prenatal vitamin w/FE, FA (PRENATAL 1 + 1) 27-1 MG TABS Take 1 tablet by mouth daily.      . fluticasone (FLONASE) 50 MCG/ACT nasal spray Place 2 sprays into both nostrils daily. (Patient not taking: Reported on 06/29/2017) 16 g 0  . loratadine (CLARITIN) 10 MG tablet Take 1 tablet (10 mg total) by mouth daily. (Patient not taking: Reported on 06/29/2017) 30 tablet 0  . benzocaine (ORAJEL) 10 % mucosal gel Use as directed 1 application in the mouth or throat as needed for mouth pain. (Patient not taking: Reported on 12/18/2016) 5.3 g 0  . ibuprofen (ADVIL,MOTRIN) 600 MG tablet Take 1 tablet (600 mg total) by mouth every 6 (six) hours as needed (Take with food). (Patient not taking: Reported on 06/29/2017) 30 tablet 0   No facility-administered medications prior to visit.     ROS Review of Systems  Constitutional: Negative.   Respiratory: Negative.   Cardiovascular: Negative.   Gastrointestinal: Positive for nausea and vomiting.  Neurological: Positive for headaches. Negative for weakness.  Psychiatric/Behavioral: Negative for suicidal ideas.    Objective:  BP 96/61 (BP Location: Left Arm, Patient Position: Sitting, Cuff Size: Normal)   Pulse 66   Temp 98.3 F (36.8 C) (Oral)   Resp 18   Ht  (1.575 m)   Wt 173 lb (78.5 kg)   LMP 05/03/2017   SpO2 99%   BMI 31.64 kg/m   BP/Weight 06/29/2017 05/29/2017 01/03/2017  Systolic BP 96 132  98  Diastolic BP 61 60 62  Wt. (Lbs) 173 - 172  BMI 31.64 - 30.47     Physical Exam  Constitutional: She appears well-developed and well-nourished.  HENT:  Head: Normocephalic and atraumatic.  Right Ear: External ear normal.  Left Ear: External ear normal.  Nose: Nose normal.  Mouth/Throat: Oropharynx is clear and moist.  Eyes: Pupils are equal, round, and reactive to light. Conjunctivae are normal.  Neck: Normal range of motion. Neck supple.  Cardiovascular: Normal rate, regular rhythm, normal heart sounds and intact distal pulses.   Pulmonary/Chest: Effort normal and breath sounds normal.  Abdominal: Soft. Bowel sounds are normal. There is no tenderness.  Lymphadenopathy:    She has no cervical adenopathy.  Skin: Skin is warm and dry.  Psychiatric: She has a normal mood and affect.  Nursing note and vitals reviewed.    Assessment & Plan:   1. Screening cholesterol level  - Lipid Panel  2. Nausea and vomiting during pregnancy  - ondansetron (ZOFRAN) 4 MG tablet; TAKE ONE TABLET EVERY 8 HOURS AS NEEDED FOR NAUSEA AND VOMITING.  Dispense: 20 tablet; Refill: 0  3. History of vitamin D deficiency  - Vitamin D, 25-hydroxy  4. Needs flu shot  - Flu Vaccine QUAD 36+ mos IM  5. Acute nonintractable headache, unspecified headache type Suspect headaches related to dehydration due to N/V and dec fluid intake Increase water and salt intake. Recommend 5 to 6  small meals per day.  Follow up with ob gyn. - acetaminophen (TYLENOL) 500 MG tablet; Take 2 tablets (1,000 mg total) by mouth every 8 (eight) hours as needed for moderate pain or headache.  Dispense: 30 tablet; Refill: 0   Meds ordered this encounter  Medications  . DISCONTD: ondansetron (ZOFRAN) 4 MG tablet    Sig: Take 1 tablet (4 mg total) by mouth every 8 (eight) hours as needed for nausea or vomiting.    Dispense:  20 tablet    Refill:  0    Order Specific Question:   Supervising Provider    Answer:   Quentin Angst [id number]  . acetaminophen (TYLENOL) 500 MG tablet    Sig: Take 2 tablets (1,000 mg total) by mouth every 8 (eight) hours as needed for moderate pain or headache.    Dispense:  30 tablet    Refill:  0    Order Specific Question:   Supervising Provider    Answer:   Quentin Angst [id number]  . ondansetron (ZOFRAN) 4 MG tablet    Sig: TAKE ONE TABLET EVERY 8 HOURS AS NEEDED FOR NAUSEA AND VOMITING.    Dispense:  20 tablet    Refill:  0    Order Specific Question:   Supervising Provider    Answer:   Quentin Angst [id number]    Follow-up: Return As needed.   Lizbeth Bark FNP

## 2017-07-02 LAB — LIPID PANEL
CHOL/HDL RATIO: 4.1 ratio (ref 0.0–4.4)
Cholesterol, Total: 161 mg/dL (ref 100–199)
HDL: 39 mg/dL — AB (ref >39)
LDL CALC: 79 mg/dL (ref 0–99)
Triglycerides: 213 mg/dL — ABNORMAL HIGH (ref 0–149)
VLDL CHOLESTEROL CAL: 43 mg/dL — AB (ref 5–40)

## 2017-07-02 LAB — VITAMIN D 25 HYDROXY (VIT D DEFICIENCY, FRACTURES): Vit D, 25-Hydroxy: 13.2 ng/mL — ABNORMAL LOW (ref 30.0–100.0)

## 2017-07-05 LAB — OB RESULTS CONSOLE RUBELLA ANTIBODY, IGM: RUBELLA: IMMUNE

## 2017-07-05 LAB — OB RESULTS CONSOLE ANTIBODY SCREEN: Antibody Screen: NEGATIVE

## 2017-07-05 LAB — OB RESULTS CONSOLE HIV ANTIBODY (ROUTINE TESTING): HIV: NONREACTIVE

## 2017-07-05 LAB — OB RESULTS CONSOLE ABO/RH: RH TYPE: POSITIVE

## 2017-07-05 LAB — OB RESULTS CONSOLE GC/CHLAMYDIA
Chlamydia: NEGATIVE
Gonorrhea: NEGATIVE

## 2017-07-05 LAB — OB RESULTS CONSOLE HEPATITIS B SURFACE ANTIGEN: HEP B S AG: NEGATIVE

## 2017-07-05 LAB — OB RESULTS CONSOLE RPR: RPR: NONREACTIVE

## 2017-07-06 ENCOUNTER — Other Ambulatory Visit: Payer: Self-pay | Admitting: Nurse Practitioner

## 2017-07-06 DIAGNOSIS — Z3682 Encounter for antenatal screening for nuchal translucency: Secondary | ICD-10-CM

## 2017-07-25 ENCOUNTER — Encounter (HOSPITAL_COMMUNITY): Payer: Self-pay | Admitting: Nurse Practitioner

## 2017-07-31 ENCOUNTER — Encounter (HOSPITAL_COMMUNITY): Payer: Self-pay | Admitting: *Deleted

## 2017-08-01 ENCOUNTER — Telehealth: Payer: Self-pay

## 2017-08-01 ENCOUNTER — Other Ambulatory Visit: Payer: Self-pay | Admitting: Family Medicine

## 2017-08-01 ENCOUNTER — Telehealth: Payer: Self-pay | Admitting: Pharmacist

## 2017-08-01 DIAGNOSIS — E559 Vitamin D deficiency, unspecified: Secondary | ICD-10-CM

## 2017-08-01 DIAGNOSIS — E782 Mixed hyperlipidemia: Secondary | ICD-10-CM

## 2017-08-01 MED ORDER — COLESEVELAM HCL 3.75 G PO PACK: 1.0000 | PACK | Freq: Every day | ORAL | 6 refills | Status: DC

## 2017-08-01 MED ORDER — VITAMIN D 1000 UNITS PO TABS: 1000.0000 [IU] | ORAL_TABLET | Freq: Every day | ORAL | 6 refills | Status: DC

## 2017-08-01 NOTE — Telephone Encounter (Signed)
CMA call regarding lab results   Patient Verify DOB   Patient was aware and understood  

## 2017-08-01 NOTE — Telephone Encounter (Signed)
Colesevelam packets are not available at the pharmacy. If therapy is indicated, please switch to the tablets.

## 2017-08-01 NOTE — Telephone Encounter (Signed)
-----   Message from Lizbeth BarkMandesia R Hairston, FNP sent at 08/01/2017  8:04 AM EST ----- Vitamin D level was low. Vitamin D helps to keep bones strong. You were prescribed cholecalciferol tablets to increase your vitamin-d level. Continue to take your pre-natal vitamins. Lipid levels were elevated. This can increase your risk of heart disease overtime. You have been prescribed colesevelam to help lower levels. Eat a diet low in saturated fat. Limit your intake of fried foods, red meats, and whole milk. Increase activity. Recommend follow up in 8 weeks.

## 2017-08-02 ENCOUNTER — Ambulatory Visit (HOSPITAL_COMMUNITY)
Admission: RE | Admit: 2017-08-02 | Discharge: 2017-08-02 | Disposition: A | Discharge status: Home / Self Care | Payer: Self-pay | Source: Ambulatory Visit | Attending: Nurse Practitioner | Admitting: Nurse Practitioner

## 2017-08-02 ENCOUNTER — Other Ambulatory Visit: Payer: Self-pay | Admitting: Family Medicine

## 2017-08-02 ENCOUNTER — Ambulatory Visit (HOSPITAL_COMMUNITY)
Admission: RE | Admit: 2017-08-02 | Discharge: 2017-08-02 | Disposition: A | Discharge status: Home / Self Care | Payer: Self-pay | Source: Ambulatory Visit | Attending: Family Medicine | Admitting: Family Medicine

## 2017-08-02 ENCOUNTER — Encounter (HOSPITAL_COMMUNITY): Payer: Self-pay

## 2017-08-02 DIAGNOSIS — E782 Mixed hyperlipidemia: Secondary | ICD-10-CM

## 2017-08-02 DIAGNOSIS — Z3682 Encounter for antenatal screening for nuchal translucency: Secondary | ICD-10-CM | POA: Insufficient documentation

## 2017-08-02 HISTORY — DX: Other specified health status: Z78.9

## 2017-08-02 MED ORDER — COLESEVELAM HCL 625 MG PO TABS: 1250.0000 mg | ORAL_TABLET | Freq: Two times a day (BID) | ORAL | 2 refills | Status: DC

## 2017-08-02 MED FILL — **WELCHOL 625 MG TABLET: 625 MG | 30 days supply | Qty: 120 | Fill #0

## 2017-08-02 NOTE — Telephone Encounter (Signed)
Switched to tablets

## 2017-08-17 ENCOUNTER — Ambulatory Visit: Payer: Self-pay

## 2017-08-20 ENCOUNTER — Other Ambulatory Visit (HOSPITAL_COMMUNITY): Payer: Self-pay

## 2017-09-18 NOTE — L&D Delivery Note (Signed)
Patient is a 28 y.o. now G4P4 s/p VBAC at [redacted]w[redacted]d, who was admitted for SOL.  She progressed without augmentation to complete and pushed 10 minutes to deliver.  Cord clamping delayed by several minutes then clamped by CNM and cut by FOB.  Placenta intact and spontaneous, bleeding minimal.  Periurethral and 1st degree laceration repaired without difficulty.  Mom and baby stable prior to transfer to postpartum. She plans on breastfeeding. She is unsure method for birth control.  Delivery Note At 1:16 AM a viable and healthy female was delivered via Vaginal, Spontaneous (Presentation: LOA).  APGAR: 9,9 ; weight pending .   Placenta intact and spontaneous, bleeding minimal. 3VCord:  with no complications:  Anesthesia: Epidural   Episiotomy: None Lacerations: Periurethral and 1st degree laceration  Suture Repair: 4.0 monocryl  Est. Blood Loss (mL):  350  Mom to postpartum.  Baby to Couplet care / Skin to Skin.  Sharyon Cable CNM 02/13/2018, 1:52 AM

## 2018-01-10 LAB — OB RESULTS CONSOLE GBS: STREP GROUP B AG: NEGATIVE

## 2018-02-12 ENCOUNTER — Inpatient Hospital Stay (HOSPITAL_COMMUNITY)
Admission: AD | Admit: 2018-02-12 | Discharge: 2018-02-14 | DRG: 807 | Disposition: A | Discharge status: Home / Self Care | Payer: Medicaid Other | Source: Ambulatory Visit | Attending: Family Medicine | Admitting: Family Medicine

## 2018-02-12 ENCOUNTER — Inpatient Hospital Stay (HOSPITAL_COMMUNITY): Payer: Medicaid Other | Admitting: Anesthesiology

## 2018-02-12 ENCOUNTER — Encounter (HOSPITAL_COMMUNITY): Payer: Self-pay

## 2018-02-12 DIAGNOSIS — Z3A40 40 weeks gestation of pregnancy: Secondary | ICD-10-CM | POA: Diagnosis not present

## 2018-02-12 DIAGNOSIS — O34219 Maternal care for unspecified type scar from previous cesarean delivery: Secondary | ICD-10-CM

## 2018-02-12 DIAGNOSIS — Z3483 Encounter for supervision of other normal pregnancy, third trimester: Secondary | ICD-10-CM | POA: Diagnosis present

## 2018-02-12 DIAGNOSIS — O479 False labor, unspecified: Secondary | ICD-10-CM

## 2018-02-12 LAB — TYPE AND SCREEN
ABO/RH(D): O POS
ANTIBODY SCREEN: NEGATIVE

## 2018-02-12 LAB — CBC
HEMATOCRIT: 40.9 % (ref 36.0–46.0)
HEMOGLOBIN: 13.7 g/dL (ref 12.0–15.0)
MCH: 30.9 pg (ref 26.0–34.0)
MCHC: 33.5 g/dL (ref 30.0–36.0)
MCV: 92.1 fL (ref 78.0–100.0)
Platelets: 295 10*3/uL (ref 150–400)
RBC: 4.44 MIL/uL (ref 3.87–5.11)
RDW: 14.2 % (ref 11.5–15.5)
WBC: 10.1 10*3/uL (ref 4.0–10.5)

## 2018-02-12 MED ORDER — FENTANYL 2.5 MCG/ML BUPIVACAINE 1/10 % EPIDURAL INFUSION (WH - ANES)
14.0000 mL/h | INTRAMUSCULAR | Status: DC | PRN
  Administered 2018-02-12: 14 mL/h via EPIDURAL
  Filled 2018-02-12: qty 100

## 2018-02-12 MED ORDER — PHENYLEPHRINE 40 MCG/ML (10ML) SYRINGE FOR IV PUSH (FOR BLOOD PRESSURE SUPPORT)
80.0000 ug | PREFILLED_SYRINGE | INTRAVENOUS | Status: DC | PRN
  Filled 2018-02-12: qty 5
  Filled 2018-02-12: qty 10

## 2018-02-12 MED ORDER — LACTATED RINGERS IV SOLN
INTRAVENOUS | Status: DC
  Administered 2018-02-12 – 2018-02-13 (×3): via INTRAVENOUS

## 2018-02-12 MED ORDER — OXYTOCIN 40 UNITS IN LACTATED RINGERS INFUSION - SIMPLE MED
2.5000 [IU]/h | INTRAVENOUS | Status: DC
  Filled 2018-02-12: qty 1000

## 2018-02-12 MED ORDER — SOD CITRATE-CITRIC ACID 500-334 MG/5ML PO SOLN: 30.0000 mL | ORAL | Status: DC | PRN

## 2018-02-12 MED ORDER — EPHEDRINE 5 MG/ML INJ
10.0000 mg | INTRAVENOUS | Status: DC | PRN
  Filled 2018-02-12: qty 2

## 2018-02-12 MED ORDER — OXYCODONE-ACETAMINOPHEN 5-325 MG PO TABS: 2.0000 | ORAL_TABLET | ORAL | Status: DC | PRN

## 2018-02-12 MED ORDER — DIPHENHYDRAMINE HCL 50 MG/ML IJ SOLN: 12.5000 mg | INTRAMUSCULAR | Status: DC | PRN

## 2018-02-12 MED ORDER — ACETAMINOPHEN 325 MG PO TABS: 650.0000 mg | ORAL_TABLET | ORAL | Status: DC | PRN

## 2018-02-12 MED ORDER — OXYTOCIN BOLUS FROM INFUSION: 500.0000 mL | Freq: Once | INTRAVENOUS | Status: DC

## 2018-02-12 MED ORDER — LACTATED RINGERS IV SOLN
500.0000 mL | Freq: Once | INTRAVENOUS | Status: AC
  Administered 2018-02-12: 500 mL via INTRAVENOUS

## 2018-02-12 MED ORDER — LACTATED RINGERS IV SOLN: 500.0000 mL | INTRAVENOUS | Status: DC | PRN

## 2018-02-12 MED ORDER — PHENYLEPHRINE 40 MCG/ML (10ML) SYRINGE FOR IV PUSH (FOR BLOOD PRESSURE SUPPORT)
80.0000 ug | PREFILLED_SYRINGE | INTRAVENOUS | Status: DC | PRN
  Filled 2018-02-12: qty 5

## 2018-02-12 MED ORDER — LIDOCAINE HCL (PF) 1 % IJ SOLN
30.0000 mL | INTRAMUSCULAR | Status: DC | PRN
  Filled 2018-02-12: qty 30

## 2018-02-12 MED ORDER — FENTANYL CITRATE (PF) 100 MCG/2ML IJ SOLN: 100.0000 ug | INTRAMUSCULAR | Status: DC | PRN

## 2018-02-12 MED ORDER — OXYCODONE-ACETAMINOPHEN 5-325 MG PO TABS: 1.0000 | ORAL_TABLET | ORAL | Status: DC | PRN

## 2018-02-12 MED ORDER — ONDANSETRON HCL 4 MG/2ML IJ SOLN: 4.0000 mg | Freq: Four times a day (QID) | INTRAMUSCULAR | Status: DC | PRN

## 2018-02-12 MED ORDER — LIDOCAINE HCL (PF) 1 % IJ SOLN
INTRAMUSCULAR | Status: DC | PRN
  Administered 2018-02-12: 8 mL via EPIDURAL

## 2018-02-12 NOTE — Anesthesia Procedure Notes (Signed)
Epidural Patient location during procedure: OB Start time: 02/12/2018 7:40 PM End time: 02/12/2018 7:45 PM  Staffing Anesthesiologist: Bethena Midget, MD  Preanesthetic Checklist Completed: patient identified, site marked, surgical consent, pre-op evaluation, timeout performed, IV checked, risks and benefits discussed and monitors and equipment checked  Epidural Patient position: sitting Prep: site prepped and draped and DuraPrep Patient monitoring: continuous pulse ox and blood pressure Approach: midline Location: L4-L5 Injection technique: LOR air  Needle:  Needle type: Tuohy  Needle gauge: 17 G Needle length: 9 cm and 9 Needle insertion depth: 6 cm Catheter type: closed end flexible Catheter size: 19 Gauge Catheter at skin depth: 11 cm Test dose: negative  Assessment Events: blood not aspirated, injection not painful, no injection resistance, negative IV test and no paresthesia

## 2018-02-12 NOTE — MAU Note (Signed)
Pt reports contractions,  

## 2018-02-12 NOTE — H&P (Addendum)
LABOR ADMISSION HISTORY AND PHYSICAL  Caitlyn Clark is a 27 y.o. female [phone] with IUP at [redacted]w[redacted]d by LMP presenting for Spontaneous Onset of Labor. She reports +FMs, No LOF, no VB, no blurry vision, headaches or peripheral edema, and RUQ pain.  She plans on Breast and Bottle feeding. She request Mirena IUD for birth control.  Dating: By LMP --->  Estimated Date of Delivery: 02/07/18  Prenatal History/Complications: [facility] Office: Health Dept. H/o of C-section due to NRFHT in [place] in 2007, VBACx2 VZV non-immune Language Barrier Spanish  Past Medical History: Past Medical History:  Diagnosis Date  . Medical history non-contributory   . No pertinent past medical history     Past Surgical History: Past Surgical History:  Procedure Laterality Date  . CESAREAN SECTION      Obstetrical History: OB History    Gravida  4   Para  3   Term  3   Preterm  0   AB  0   Living  3     SAB  0   TAB  0   Ectopic  0   Multiple  0   Live Births  1           Social History: Social History   Socioeconomic History  . Marital status: Married    Spouse name: Not on file  . Number of children: Not on file  . Years of education: Not on file  . Highest education level: Not on file  Occupational History  . Not on file  Social Needs  . Financial resource strain: Not on file  . Food insecurity:    Worry: Not on file    Inability: Not on file  . Transportation needs:    Medical: Not on file    Non-medical: Not on file  Tobacco Use  . Smoking status: Never Smoker  . Smokeless tobacco: Never Used  Substance and Sexual Activity  . Alcohol use: No  . Drug use: No  . Sexual activity: Yes  Lifestyle  . Physical activity:    Days per week: Not on file    Minutes per session: Not on file  . Stress: Not on file  Relationships  . Social connections:    Talks on phone: Not on file    Gets together: Not on file    Attends religious service: Not on file    Active  member of club or organization: Not on file    Attends meetings of clubs or organizations: Not on file    Relationship status: Not on file  Other Topics Concern  . Not on file  Social History Narrative  . Not on file    Family History: Family History  Problem Relation Age of Onset  . Diabetes Father   . Anesthesia problems Neg Hx     Allergies: No Known Allergies  Medications Prior to Admission  Medication Sig Dispense Refill Last Dose  . acetaminophen (TYLENOL) 500 MG tablet Take 2 tablets (1,000 mg total) by mouth every 8 (eight) hours as needed for moderate pain or headache. 30 tablet 0 Taking  . cholecalciferol (VITAMIN D) 1000 units tablet Take 1 tablet (1,000 Units total) daily by mouth. 30 tablet 6 Taking  . colesevelam (WELCHOL) 625 MG tablet Take 2 tablets (1,250 mg total) 2 (two) times daily with a meal by mouth. 120 tablet 2   . fluticasone (FLONASE) 50 MCG/ACT nasal spray Place 2 sprays into both nostrils daily. (Patient not taking: Reported on  06/29/2017) 16 g 0 Not Taking  . loratadine (CLARITIN) 10 MG tablet Take 1 tablet (10 mg total) by mouth daily. (Patient not taking: Reported on 06/29/2017) 30 tablet 0 Not Taking  . ondansetron (ZOFRAN) 4 MG tablet TAKE ONE TABLET EVERY 8 HOURS AS NEEDED FOR NAUSEA AND VOMITING. (Patient not taking: Reported on 08/02/2017) 20 tablet 0 Not Taking  . prenatal vitamin w/FE, FA (PRENATAL 1 + 1) 27-1 MG TABS Take 1 tablet by mouth daily.     Taking    Review of Systems   All systems reviewed and negative except as stated in HPI  Blood pressure (!) 146/84, pulse (!) 7, resp. rate 18, height 5' 2.5" (1.588 m), weight 172 lb (78 kg), last menstrual period 05/03/2017, SpO2 100 %. General appearance: alert and no distress HEENT: no JVD, MMM Abdomen: soft, non-tender; bowel sounds normal Extremities: Homans sign is negative, no sign of DVT Presentation: unsure Fetal monitoringBaseline: 130 bpm, Variability: Good {> 6 bpm) and  Accelerations: Reactive Uterine activityFrequency: Every 2-5 minutes Dilation: 6 Effacement (%): 100 Station: -2 Exam by:: Nancee Liter, RN    Prenatal labs: ABO, Rh:  O+ Antibody:  Neg Rubella:  Immune RPR:   NR HBsAg:  Neg  HIV:   NR GBS:   neg 1 hr Glucola wnl  Prenatal Transfer Tool  Maternal Diabetes: No Genetic Screening: Normal Maternal Ultrasounds/Referrals: Normal Fetal Ultrasounds or other Referrals:  None Maternal Substance Abuse:  No Significant Maternal Medications:  None Significant Maternal Lab Results: None  No results found for this or any previous visit (from the past 24 hour(s)).  Patient Active Problem List   Diagnosis Date Noted  . Vaginal delivery 12/29/2011  . VBAC, delivered, current hospitalization 12/29/2011  . Previous cesarean section 10/25/2011    Assessment: Caitlyn Clark is a 27 y.o. G4P3003 at [redacted]w[redacted]d here for SOL.   #Labor: H/o C/S w/ VBACx2. Progressing well, cont to monitor #Pain: Epidural #FWB: Cat I #ID:  GBS neg #MOF: Both #MOC:IUD - Mirena  Thomes Dinning, MD, MS FAMILY MEDICINE RESIDENT - PGY1 02/12/2018 6:53 PM  I confirm that I have verified the information documented in the resident's note and that I have also personally reperformed the physical exam and all medical decision making activities.   Thressa Sheller 8:12 PM 02/12/18

## 2018-02-12 NOTE — Progress Notes (Signed)
Interpreter in room before end of her shift.  Patient was asked if she had any needs, to which she replied "no". Pain level assessed.  Reiterated the signs and symptoms of progression of labor and to use the call bell.

## 2018-02-12 NOTE — Anesthesia Preprocedure Evaluation (Signed)

## 2018-02-13 ENCOUNTER — Encounter (HOSPITAL_COMMUNITY): Payer: Self-pay | Admitting: *Deleted

## 2018-02-13 DIAGNOSIS — Z3A40 40 weeks gestation of pregnancy: Secondary | ICD-10-CM

## 2018-02-13 DIAGNOSIS — O34219 Maternal care for unspecified type scar from previous cesarean delivery: Secondary | ICD-10-CM

## 2018-02-13 LAB — RPR: RPR Ser Ql: NONREACTIVE

## 2018-02-13 MED ORDER — DIPHENHYDRAMINE HCL 25 MG PO CAPS: 25.0000 mg | ORAL_CAPSULE | Freq: Four times a day (QID) | ORAL | Status: DC | PRN

## 2018-02-13 MED ORDER — DIBUCAINE 1 % RE OINT: 1.0000 "application " | TOPICAL_OINTMENT | RECTAL | Status: DC | PRN

## 2018-02-13 MED ORDER — METHYLERGONOVINE MALEATE 0.2 MG PO TABS: 0.2000 mg | ORAL_TABLET | ORAL | Status: DC | PRN

## 2018-02-13 MED ORDER — WITCH HAZEL-GLYCERIN EX PADS: 1.0000 "application " | MEDICATED_PAD | CUTANEOUS | Status: DC | PRN

## 2018-02-13 MED ORDER — SIMETHICONE 80 MG PO CHEW: 80.0000 mg | CHEWABLE_TABLET | ORAL | Status: DC | PRN

## 2018-02-13 MED ORDER — ZOLPIDEM TARTRATE 5 MG PO TABS: 5.0000 mg | ORAL_TABLET | Freq: Every evening | ORAL | Status: DC | PRN

## 2018-02-13 MED ORDER — ONDANSETRON HCL 4 MG/2ML IJ SOLN: 4.0000 mg | INTRAMUSCULAR | Status: DC | PRN

## 2018-02-13 MED ORDER — ACETAMINOPHEN 325 MG PO TABS: 650.0000 mg | ORAL_TABLET | ORAL | Status: DC | PRN

## 2018-02-13 MED ORDER — IBUPROFEN 600 MG PO TABS
600.0000 mg | ORAL_TABLET | Freq: Four times a day (QID) | ORAL | Status: DC
  Administered 2018-02-13 – 2018-02-14 (×6): 600 mg via ORAL
  Filled 2018-02-13 (×6): qty 1

## 2018-02-13 MED ORDER — BENZOCAINE-MENTHOL 20-0.5 % EX AERO
1.0000 "application " | INHALATION_SPRAY | CUTANEOUS | Status: DC | PRN
  Administered 2018-02-13: 1 via TOPICAL
  Filled 2018-02-13: qty 56

## 2018-02-13 MED ORDER — MISOPROSTOL 200 MCG PO TABS: 400.0000 ug | ORAL_TABLET | Freq: Once | ORAL | Status: DC

## 2018-02-13 MED ORDER — SENNOSIDES-DOCUSATE SODIUM 8.6-50 MG PO TABS
2.0000 | ORAL_TABLET | ORAL | Status: DC
  Administered 2018-02-13: 2 via ORAL
  Filled 2018-02-13: qty 2

## 2018-02-13 MED ORDER — TETANUS-DIPHTH-ACELL PERTUSSIS 5-2.5-18.5 LF-MCG/0.5 IM SUSP: 0.5000 mL | Freq: Once | INTRAMUSCULAR | Status: DC

## 2018-02-13 MED ORDER — ONDANSETRON HCL 4 MG PO TABS: 4.0000 mg | ORAL_TABLET | ORAL | Status: DC | PRN

## 2018-02-13 MED ORDER — MISOPROSTOL 200 MCG PO TABS
ORAL_TABLET | ORAL | Status: AC
  Filled 2018-02-13: qty 5

## 2018-02-13 MED ORDER — PRENATAL MULTIVITAMIN CH
1.0000 | ORAL_TABLET | Freq: Every day | ORAL | Status: DC
  Administered 2018-02-13 – 2018-02-14 (×2): 1 via ORAL
  Filled 2018-02-13 (×2): qty 1

## 2018-02-13 MED ORDER — COCONUT OIL OIL: 1.0000 "application " | TOPICAL_OIL | Status: DC | PRN

## 2018-02-13 MED ORDER — MISOPROSTOL 200 MCG PO TABS
600.0000 ug | ORAL_TABLET | Freq: Once | ORAL | Status: AC
  Administered 2018-02-13: 600 ug via RECTAL

## 2018-02-13 MED ORDER — METHYLERGONOVINE MALEATE 0.2 MG/ML IJ SOLN: 0.2000 mg | INTRAMUSCULAR | Status: DC | PRN

## 2018-02-13 NOTE — Anesthesia Postprocedure Evaluation (Signed)
Anesthesia Post Note  Patient: Caitlyn Clark  Procedure(s) Performed: AN AD HOC LABOR EPIDURAL     Patient location during evaluation: Mother Baby Anesthesia Type: Epidural Level of consciousness: awake and alert Pain management: pain level controlled Vital Signs Assessment: post-procedure vital signs reviewed and stable Respiratory status: spontaneous breathing, nonlabored ventilation and respiratory function stable Cardiovascular status: stable Postop Assessment: no headache, no backache and epidural receding Anesthetic complications: no    Last Vitals:  Vitals:   02/13/18 0300 02/13/18 0330  BP: 121/65 123/65  Pulse: 71 67  Resp: 16 17  Temp:    SpO2:  99%    Last Pain:  Vitals:   02/13/18 0330  TempSrc:   PainSc: 0-No pain   Pain Goal:                 Avinash Maltos

## 2018-02-13 NOTE — Anesthesia Postprocedure Evaluation (Signed)
Anesthesia Post Note  Patient: Caitlyn Clark  Procedure(s) Performed: AN AD HOC LABOR EPIDURAL     Patient location during evaluation: Mother Baby Anesthesia Type: Epidural Level of consciousness: awake and alert and oriented Pain management: satisfactory to patient Vital Signs Assessment: post-procedure vital signs reviewed and stable Respiratory status: spontaneous breathing and nonlabored ventilation Cardiovascular status: stable Postop Assessment: no headache, no backache, no signs of nausea or vomiting, adequate PO intake, patient able to bend at knees and able to ambulate (patient up walking) Anesthetic complications: no    Last Vitals:  Vitals:   02/13/18 0429 02/13/18 0534  BP: 128/87 122/77  Pulse: 73 67  Resp: 17   Temp: 37.1 C   SpO2: 99%     Last Pain:  Vitals:   02/13/18 0534  TempSrc:   PainSc: 0-No pain   Pain Goal:                 Madison Hickman

## 2018-02-13 NOTE — Progress Notes (Addendum)
LABOR PROGRESS NOTE  Caitlyn Clark is a 27 y.o. G4P3003 at [redacted]w[redacted]d  admitted for SOL  Subjective: Patient comfortable with epidural, starting to feel more pressure in bottom   Objective: BP 129/79   Pulse 75   Temp 98.2 F (36.8 C) (Oral)   Resp 16   Ht 5' 2.5" (1.588 m)   Wt 172 lb (78 kg)   LMP 05/03/2017   SpO2 98%   BMI 30.96 kg/m  or  Vitals:   02/12/18 2200 02/12/18 2230 02/12/18 2300 02/12/18 2330  BP: 130/86 137/84 130/90 129/79  Pulse: 69 67 73 75  Resp: Temp:      TempSrc:      SpO2:      Weight:      Height:         Dilation: Lip/rim Effacement (%): 100 Cervical Position: Middle Station: Plus 2 Presentation: Vertex Exam by:: Marius Ditch, RN FHT: baseline rate 135, moderate varibility, +acel, no decel Toco: 2-3 minutes   Labs: Lab Results  Component Value Date   WBC 10.1 02/12/2018   HGB 13.7 02/12/2018   HCT 40.9 02/12/2018   MCV 92.1 02/12/2018   PLT 295 02/12/2018    Patient Active Problem List   Diagnosis Date Noted  . Uterine contractions during pregnancy 02/12/2018  . Vaginal delivery 12/29/2011  . VBAC, delivered, current hospitalization 12/29/2011  . Previous cesarean section 10/25/2011    Assessment / Plan: 27 y.o. G4P3003 at [redacted]w[redacted]d here for SOL  Labor: Expectant management  Fetal Wellbeing:  Cat I Pain Control:  Epidural  Anticipated MOD:  SVD   Sharyon Cable, CNM 02/13/2018, 12:01 AM

## 2018-02-13 NOTE — Lactation Note (Signed)
This note was copied from a baby's chart. Lactation Consultation Note Mom Spanish speaking. [employer] interpreter Lissa Hoard [phone] assisted. Mom's 4th child, mom states has fed good, sleepy now. Mom BF her 28 yr old for 37 months, 64 yr old for 1 1/2 yr and her 35 yr old for 1 yr. Mom is breast/formula feeding baby. LC encouraged to BF first before formula feeding.  Mom had baby swaddled in several blankets. Encouraged STS. Reviewed I&O charting and importance. Newborn feeding habits and behavior discussed. Mom encouraged to feed baby 8-12 times/24 hours and with feeding cues.   Mom has everted nipples. Hand expression taught w/colostrum noted. Breast slightly firm. Mom has some edema to LE. Encouraged to call for assistance or questions.  WH/LC brochure given w/resources, support groups and LC services.  Patient Name: Girl Yeni Jiggetts BJYNW'G Date: 02/13/2018 Reason for consult: Initial assessment   Maternal Data Has patient been taught Hand Expression?: Yes Does the patient have breastfeeding experience prior to this delivery?: Yes  Feeding Feeding Type: Breast Fed Length of feed: 30 min  LATCH Score Latch: Too sleepy or reluctant, no latch achieved, no sucking elicited.  Audible Swallowing: None  Type of Nipple: Everted at rest and after stimulation  Comfort (Breast/Nipple): Soft / non-tender  Hold (Positioning): No assistance needed to correctly position infant at breast.  LATCH Score: 6  Interventions Interventions: Breast feeding basics reviewed;Skin to skin;Breast massage;Hand express;Breast compression  Lactation Tools Discussed/Used WIC Program: Yes   Consult Status Consult Status: Follow-up Date: 02/14/18 Follow-up type: In-patient    Quenesha Douglass, Diamond Nickel 02/13/2018, 4:08 AM

## 2018-02-13 NOTE — Addendum Note (Signed)
Addendum  created 02/13/18 0753 by Shanon Payor, CRNA   Charge Capture section accepted, Sign clinical note

## 2018-02-14 MED ORDER — IBUPROFEN 600 MG PO TABS: 600.0000 mg | ORAL_TABLET | Freq: Four times a day (QID) | ORAL | 0 refills | Status: DC

## 2018-02-14 NOTE — Discharge Summary (Addendum)
OB Discharge Summary     Patient Name: Caitlyn Clark DOB: 02/16/1991 MRN: [id number]  Date of admission: 02/12/2018 Delivering MD: Sharyon Cable   Date of discharge: 02/14/2018  Admitting diagnosis: 40WKS CTX Intrauterine pregnancy: [redacted]w[redacted]d     Secondary diagnosis:  Principal Problem:   VBAC (vaginal birth after Cesarean) Active Problems:   Uterine contractions during pregnancy  Additional problems:  H/o of C-section due to NRFHT in [place] in 2007, VBACx2 VZV non-immune     Discharge diagnosis: Term Pregnancy Delivered and VBAC                                                                                                Post partum procedures:none  Augmentation: no  Complications: None  Hospital course:  Onset of Labor With Vaginal Delivery     27 y.o. yo [id number] at [redacted]w[redacted]d was admitted in Active Labor on 02/12/2018. Patient had an uncomplicated labor course as follows:  Membrane Rupture Time/Date: 11:25 PM ,02/12/2018   Intrapartum Procedures: Episiotomy: None [1]                                         Lacerations:  Periurethral [8];1st degree [2]  Patient had a delivery of a Viable infant. 02/13/2018  Information for the patient's newborn:  Tekeyah, Santiago [[id number]]  Delivery Method: VBAC, Spontaneous(Filed from Delivery Summary)    Pateint had an uncomplicated postpartum course.  She is ambulating, tolerating a regular diet, passing flatus, and urinating well. Patient is discharged home in stable condition on 02/14/18.   Physical exam  Vitals:   02/13/18 0534 02/13/18 0754 02/13/18 1606 02/14/18 0614  BP: 122/77 110/68 117/71 106/72  Pulse: 67 61 74 76  Resp:  Temp:  98.4 F (36.9 C) 98.1 F (36.7 C) 97.9 F (36.6 C)  TempSrc:  Oral Oral Oral  SpO2:   99% 100%  Weight:      Height:       General: alert, cooperative and no distress Lochia: appropriate Uterine Fundus: firm Incision: N/A DVT Evaluation: No evidence of  DVT seen on physical exam. Labs: Lab Results  Component Value Date   WBC 10.1 02/12/2018   HGB 13.7 02/12/2018   HCT 40.9 02/12/2018   MCV 92.1 02/12/2018   PLT 295 02/12/2018   CMP Latest Ref Rng & Units 01/03/2017  Glucose 65 - 99 mg/dL 92  BUN 6 - 20 mg/dL 7  Creatinine 1.30 - 8.65 mg/dL 7.84(O)  Sodium 962 - 952 mmol/L 140  Potassium 3.5 - 5.2 mmol/L 4.3  Chloride 96 - 106 mmol/L 101  CO2 18 - 29 mmol/L 25  Calcium 8.7 - 10.2 mg/dL 9.5  Total Protein 6.0 - 8.5 g/dL 7.9  Total Bilirubin 0.0 - 1.2 mg/dL 0.4  Alkaline Phos 39 - 117 IU/L 90  AST 0 - 40 IU/L 26  ALT 0 - 32 IU/L 33(H)    Discharge instruction: per After Visit Summary and "Baby and Me Booklet".  After visit meds:  Allergies as of 02/14/2018   No Known Allergies     Medication List    STOP taking these medications   acetaminophen 500 MG tablet Commonly known as:  TYLENOL     TAKE these medications   cholecalciferol 1000 units tablet Commonly known as:  VITAMIN D Take 1 tablet (1,000 Units total) daily by mouth.   colesevelam 625 MG tablet Commonly known as:  WELCHOL Take 2 tablets (1,250 mg total) 2 (two) times daily with a meal by mouth.   ibuprofen 600 MG tablet Commonly known as:  ADVIL,MOTRIN Take 1 tablet (600 mg total) by mouth every 6 (six) hours.   prenatal vitamin w/FE, FA 27-1 MG Tabs tablet Take 1 tablet by mouth daily.       Diet: routine diet  Activity: Advance as tolerated. Pelvic rest for 6 weeks.   Outpatient follow up:4 weeks Follow up Appt:No future appointments. Follow up Visit: Follow-up Information    Department, [facility]. Schedule an appointment as soon as possible for a visit in 4 week(s).   Contact information: [address] [place] [address] [phone]           Postpartum contraception: Nexplanon  Newborn Data: Live born female  Birth Weight: 6 lb 9.3 oz (2985 g) APGAR: 9, 9  Newborn Delivery   Birth date/time:   02/13/2018 01:16:00 Delivery type:  VBAC, Spontaneous     Baby Feeding: Bottle and Breast Disposition:home with mother   02/14/2018 Felisa Bonier, MD, PGY-1 Family Medicine - [facility]  OB FELLOW DISCHARGE ATTESTATION  I have seen and examined this patient. I agree with above documentation and have made edits as needed.   Caryl Ada, DO OB Fellow 2:27 PM

## 2018-02-14 NOTE — Progress Notes (Signed)
Discharge Teaching for mother and infant using [employer] Spanish Interpreter Tammy # [phone]

## 2018-02-14 NOTE — Progress Notes (Signed)
[employer] Spanish Interpreter, Melanee Spry [phone], used for question, assessment, and discussed about mother being discharged and infant not, at this time.

## 2018-09-09 ENCOUNTER — Ambulatory Visit: Payer: Self-pay | Attending: Family Medicine

## 2018-10-03 ENCOUNTER — Ambulatory Visit: Payer: Self-pay | Admitting: Family Medicine

## 2018-10-31 ENCOUNTER — Ambulatory Visit: Payer: Self-pay | Attending: Family Medicine | Admitting: Family Medicine

## 2018-10-31 ENCOUNTER — Encounter: Payer: Self-pay | Admitting: Family Medicine

## 2018-10-31 VITALS — BP 109/72 | HR 73 | Temp 99.0°F | Resp 18 | Ht 63.5 in | Wt 163.0 lb

## 2018-10-31 DIAGNOSIS — Z23 Encounter for immunization: Secondary | ICD-10-CM

## 2018-10-31 DIAGNOSIS — Z0000 Encounter for general adult medical examination without abnormal findings: Secondary | ICD-10-CM

## 2018-10-31 DIAGNOSIS — H6691 Otitis media, unspecified, right ear: Secondary | ICD-10-CM

## 2018-10-31 MED ORDER — AMOXICILLIN 500 MG PO CAPS: 500.0000 mg | ORAL_CAPSULE | Freq: Two times a day (BID) | ORAL | 0 refills | Status: AC

## 2018-10-31 MED FILL — AMOXICILLIN 500 MG CAPSULE: 500 | 7 days supply | Qty: 14 | Fill #0

## 2018-10-31 NOTE — Progress Notes (Signed)
Subjective:    Patient ID: Caitlyn Clark, female    DOB: 02/28/1991, 28 y.o.   MRN: [id number]   Due to a language barrier, [employer] video interpretation system used at today's visit  HPI       28 yo female who was last seen in the office in October 2018 who presents for annual well exam.  Patient states that her Pap smear/pelvic exam is up-to-date by GYN.  She reports no history of abnormal Pap smears.  Patient reports that she does not have any current health issues other than recent onset of a dull, aching pain which comes and goes in her right ear.  Pain has been as high as a 6 on a 0-to-10 scale.  Patient has found no abnormalities on self breast exam.  Patient did give birth in May 2019.  Patient has been on medication for her cholesterol.  She denies any increased muscle aches with the use of cholesterol medication.  She did stop cholesterol medication during her pregnancy and while breast-feeding.   Past Medical History:  Diagnosis Date  . Medical history non-contributory   . No pertinent past medical history   On review of chart, patient has had past diagnosis of hyperlipidemia and vitamin D deficiency Past Surgical History:  Procedure Laterality Date  . CESAREAN SECTION     Family History  Problem Relation Age of Onset  . Diabetes Father   . Anesthesia problems Neg Hx    Social History   Tobacco Use  . Smoking status: Never Smoker  . Smokeless tobacco: Never Used  Substance Use Topics  . Alcohol use: No  . Drug use: No   No Known Allergies    Review of Systems     Objective:   Physical Exam Constitutional:      General: She is not in acute distress.    Appearance: Normal appearance.  HENT:     Head: Normocephalic and atraumatic.     Right Ear: Hearing, ear canal and external ear normal. Tympanic membrane is erythematous.     Left Ear: Hearing, tympanic membrane, ear canal and external ear normal.     Nose: No mucosal edema, congestion or  rhinorrhea.     Right Sinus: No maxillary sinus tenderness or frontal sinus tenderness.     Left Sinus: No maxillary sinus tenderness or frontal sinus tenderness.     Mouth/Throat:     Mouth: Mucous membranes are moist. No oral lesions.     Pharynx: Oropharynx is clear. No oropharyngeal exudate.  Neck:     Musculoskeletal: Neck supple. Muscular tenderness present.  Cardiovascular:     Rate and Rhythm: Normal rate and regular rhythm.  Pulmonary:     Effort: Pulmonary effort is normal.     Breath sounds: Normal breath sounds.  Abdominal:     Palpations: Abdomen is soft.     Tenderness: There is no abdominal tenderness. There is no right CVA tenderness, left CVA tenderness, guarding or rebound.  Musculoskeletal:     Right lower leg: No edema.     Left lower leg: No edema.  Lymphadenopathy:     Cervical: Cervical adenopathy (Mild right upper cervical chain lymphadenopathy) present.  Skin:    General: Skin is warm and dry.  Neurological:     General: No focal deficit present.     Mental Status: She is alert and oriented to person, place, and time.  Psychiatric:        Mood and Affect: Mood normal.  Behavior: Behavior normal.        Thought Content: Thought content normal.        Judgment: Judgment normal.    BP 109/72 (BP Location: Right Arm, Patient Position: Sitting, Cuff Size: Normal)   Pulse 73   Temp 99 F (37.2 C) (Oral)   Resp 18   Ht 5' 3.5" (1.613 m)   Wt 163 lb (73.9 kg)   SpO2 98%   BMI 28.42 kg/m         Assessment & Plan:  1. Well female exam without gynecological exam Patient reports that her Pap/pelvic exam are up-to-date and she is also had breast exam done per GYN.  Patient is encouraged to have fasting lab visit in the future as she does report a history of hyperlipidemia.  Patient is encouraged to follow healthy diet along with regular exercise.  2. Right otitis media, unspecified otitis media type Patient with evidence of otitis media on exam.   She may take Tylenol as needed for ear pain and prescription provided for amoxicillin - amoxicillin (AMOXIL) 500 MG capsule; Take 1 capsule (500 mg total) by mouth 2 (two) times daily for 7 days.  Dispense: 14 capsule; Refill: 0  3. Need for immunization against influenza Patient was offered and agreed to have influenza immunization at today's visit - Flu Vaccine QUAD 36+ mos IM  An After Visit Summary was printed and given to the patient.  Allergies as of 10/31/2018   No Known Allergies     Medication List       Accurate as of October 31, 2018 11:59 PM. If you have any questions, ask your nurse or doctor.        amoxicillin 500 MG capsule Commonly known as:  AMOXIL Take 1 capsule (500 mg total) by mouth 2 (two) times daily for 7 days. Started by:  Cain Saupe, MD   cholecalciferol 1000 units tablet Commonly known as:  VITAMIN D Take 1 tablet (1,000 Units total) daily by mouth.   colesevelam 625 MG tablet Commonly known as:  WELCHOL Take 2 tablets (1,250 mg total) 2 (two) times daily with a meal by mouth.   ibuprofen 600 MG tablet Commonly known as:  ADVIL Take 1 tablet (600 mg total) by mouth every 6 (six) hours.   prenatal vitamin w/FE, FA 27-1 MG Tabs tablet Take 1 tablet by mouth daily.       Return if symptoms worsen or fail to improve, for as needed and for yearly well exam.

## 2018-10-31 NOTE — Patient Instructions (Signed)
[place] (Health Maintenance, Female) Un estilo de vida saludable y los cuidados preventivos pueden favorecer considerablemente a la salud y Musician. Pregunte a su mdico cul es el cronograma de exmenes peridicos apropiado para usted. Esta es una buena oportunidad para consultarlo sobre cmo prevenir enfermedades y [place] sano. Adems de los controles, hay muchas otras cosas que puede hacer usted mismo. Los expertos han realizado numerosas investigaciones [employer] cambios en el estilo de vida y las medidas de prevencin que, [place], lo ayudarn a mantenerse sano. Solicite a su mdico ms informacin. EL PESO Y LA DIETA Consuma una dieta saludable.  Asegrese de [employer] verduras, frutas, productos lcteos de bajo contenido de [place] y Advertising account planner.  No consuma muchos alimentos de alto contenido de grasas slidas, azcares agregados o sal.  Realice actividad fsica con regularidad. Esta es una de las prcticas ms importantes que puede hacer por su salud. ? La Delorise Shiner de los adultos deben hacer ejercicio durante al menos 124mnutos por semana. El ejercicio debe aumentar la frecuencia cardaca y pActorla transpiracin (ejercicio de [place]. ? La mayora de los adultos tambin deben hacer ejercicios de elongacin al mToysRusveces a la semana. Agregue esto al su plan de ejercicio de intensidad moderada. Mantenga un peso saludable.  El ndice de masa corporal ([facility] es una medida que puede utilizarse para identificar posibles problemas de pEast [place]town Proporciona una estimacin de la grasa corporal basndose en el peso y la altura. Su mdico puede ayudarle a dRadiation protection practitionerISouth Endy a lScientist, forensico mTheatre managerun peso saludable.  Para las mujeres de 20aos o ms: ? Un IJohn R. Oishei Children'S Hospitalmenor de 18,5 se considera bajo peso. ? Un ICumberland County Hospitalentre 18,5 y 24,9 es normal. ? Un IPelham Medical Centerentre 25 y 29,9 se considera sobrepeso. ? Un IMC de 30 o ms se considera  obesidad. Observe los niveles de colesterol y lpidos en la sangre.  Debe comenzar a rEnglish as a second language teacherde lpidos y cResearch officer, trade unionen la sangre a los 20aos y luego repetirlos cada 516aos  Es posible que nAutomotive engineerlos niveles de colesterol con mayor frecuencia si: ? Sus niveles de lpidos y colesterol son altos. ? Es mayor de 527CWC ? Presenta un alto riesgo de padecer enfermedades cardacas. DETECCIN DE CNCER Cncer de pulmn  Se recomienda realizar exmenes de deteccin de cncer de pulmn a personas adultas entre 574y 892aos que estn en riesgo de dHorticulturist, commercialde pulmn por sus antecedentes de consumo de tabaco.  Se recomienda una tomografa computarizada de baja dosis de los pulmones todos los aos a las personas que: ? Fuman actualmente. ? Hayan dejado el hbito en algn momento en los ltimos 15aos. ? Hayan fumado durante 30aos un paquete diario. Un paquete-ao equivale a fumar un promedio de un paquete de cigarrillos diario durante un ao.  Los exmenes de deteccin anuales deben continuar hasta que hayan pasado 15aos desde que dej de fumar.  Ya no debern realizarse si tiene un problema de salud que le impida recibir tratamiento para eScience writerde pulmn. Cncer de mama  Practique la autoconciencia de la mama. Esto significa reconocer la apariencia normal de sus mamas y cmo las siente.  Tambin significa realizar autoexmenes regulares de lJohnson & Johnson Informe a su mdico sobre cualquier cambio, sin importar cun pequeo sea.  Si tiene entre 20 y 363aos, un mdico debe realizarle un examen clnico de las mamas como parte del examen regular de s[place] cada 1 a  3aos.  Si tiene 40aos o ms, debe realizarse un examen clnico de las mamas todos los aos. Tambin considere realizarse una radiografa de las mamas (mamografa) todos los aos.  Si tiene antecedentes familiares de cncer de mama, hable con su mdico para someterse a un estudio gentico.  Si  tiene alto riesgo de padecer cncer de mama, hable con su mdico para someterse a una resonancia magntica y una mamografa todos los aos.  La evaluacin del gen del cncer de mama (BRCA) se recomienda a mujeres que tengan familiares con cnceres relacionados con el BRCA. Los cnceres relacionados con el BRCA incluyen los siguientes: ? Mama. ? Ovario. ? Trompas. ? Cnceres de peritoneo.  Los resultados de la evaluacin determinarn la necesidad de asesoramiento gentico y de anlisis de BRCA1 y BRCA2. Cncer de cuello del tero El mdico puede recomendarle que se haga pruebas peridicas de deteccin de cncer de los rganos de la pelvis (ovarios, tero y vagina). Estas pruebas incluyen un examen plvico, que abarca controlar si se produjeron cambios microscpicos en la superficie del cuello del tero (prueba de Papanicolaou). Pueden recomendarle que se haga estas pruebas cada 3aos, a partir de los 21aos.  A las mujeres que tienen entre 30 y 65aos, los mdicos pueden recomendarles que se sometan a exmenes plvicos y pruebas de Papanicolaou cada 3aos, o a la prueba de Papanicolaou y el examen plvico en combinacin con estudios de deteccin del virus del papiloma humano (VPH) cada 5aos. Algunos tipos de VPH aumentan el riesgo de padecer cncer de cuello del tero. La prueba para la deteccin del VPH tambin puede realizarse a mujeres de cualquier edad cuyos resultados de la prueba de Papanicolaou no sean claros.  Es posible que otros mdicos no recomienden exmenes de deteccin a mujeres no embarazadas que se consideran sujetos de bajo riesgo de padecer cncer de pelvis y que no tienen sntomas. Pregntele al mdico si un examen plvico de deteccin es adecuado para usted.  Si ha recibido un tratamiento para el cncer cervical o una enfermedad que podra causar cncer, necesitar realizarse una prueba de Papanicolaou y controles durante al menos 20 aos de concluido el tratamiento. Si no se  ha hecho el Papanicolaou con regularidad, debern volver a evaluarse los factores de riesgo (como tener un nuevo compaero sexual), para determinar si debe realizarse los estudios nuevamente. Algunas mujeres sufren problemas mdicos que aumentan la probabilidad de contraer cncer de cuello del tero. En estos casos, el mdico podr indicar que se realicen controles y pruebas de Papanicolaou con ms frecuencia. Cncer colorrectal  Este tipo de cncer puede detectarse y a menudo prevenirse.  Por lo general, los estudios de rutina se deben comenzar a hacer a partir de los 50 aos y hasta los 75 aos.  Sin embargo, el mdico podr aconsejarle que lo haga antes, si tiene factores de riesgo para el cncer de colon.  Tambin puede recomendarle que use un kit de prueba para hallar sangre oculta en la materia fecal.  Es posible que se use una pequea cmara en el extremo de un tubo para examinar directamente el colon (sigmoidoscopia o colonoscopia) a fin de detectar formas tempranas de cncer colorrectal.  Los exmenes de rutina generalmente comienzan a los 50aos.  El examen directo del colon se debe repetir cada 5 a 10aos hasta los 75aos. Sin embargo, es posible que se realicen exmenes con mayor frecuencia, si se detectan formas tempranas de plipos precancerosos o pequeos bultos. Cncer de piel  Revise la piel   de la cabeza a los pies con regularidad.  Informe a su mdico si aparecen nuevos lunares o los que tiene se modifican, especialmente en su forma y color.  Tambin notifique al mdico si tiene un lunar que es ms grande que el tamao de una goma de lpiz.  Siempre use pantalla solar. Aplique pantalla solar de manera libre y repetida a lo largo del da.  Protjase usando mangas y pantalones largos, un sombrero de ala ancha y gafas para el sol, siempre que se encuentre en el exterior. ENFERMEDADES CARDACAS, DIABETES E HIPERTENSIN ARTERIAL  La hipertensin arterial causa  enfermedades cardacas y aumenta el riesgo de ictus. La hipertensin arterial es ms probable en los siguientes casos: ? Las personas que tienen la presin arterial en el extremo del rango normal (100-139/85-89 mm Hg). ? Las personas con sobrepeso u obesidad. ? Las personas afroamericanas.  Si usted tiene entre 18 y 39 aos, debe medirse la presin arterial cada 3 a 5 aos. Si usted tiene 40 aos o ms, debe medirse la presin arterial todos los aos. Debe medirse la presin arterial dos veces: una vez cuando est en un hospital o una clnica y la otra vez cuando est en otro sitio. Registre el promedio de las dos mediciones. Para controlar su presin arterial cuando no est en un hospital o una clnica, puede usar lo siguiente: ? Una mquina automtica para medir la presin arterial en una farmacia. ? Un monitor para medir la presin arterial en el hogar.  Si tiene entre 55 y 79 aos, consulte a su mdico si debe tomar aspirina para prevenir el ictus.  Realcese exmenes de deteccin de la diabetes con regularidad. Esto incluye la toma de una muestra de sangre para controlar el nivel de azcar en la sangre durante el ayuno. ? Si tiene un peso normal y un bajo riesgo de padecer diabetes, realcese este anlisis cada tres aos despus de los 45aos. ? Si tiene sobrepeso y un alto riesgo de padecer diabetes, considere someterse a este anlisis antes o con mayor frecuencia. PREVENCIN DE INFECCIONES HepatitisB  Si tiene un riesgo ms alto de contraer hepatitis B, debe someterse a un examen de deteccin de este virus. Se considera que tiene un alto riesgo de contraer hepatitis B si: ? Naci en un pas donde la hepatitis B es frecuente. Pregntele a su mdico qu pases son considerados de alto riesgo. ? Sus padres nacieron en un pas de alto riesgo y usted no recibi una vacuna que lo proteja contra la hepatitis B (vacuna contra la hepatitis B). ? Tiene VIH o sida. ? Usa agujas para inyectarse  drogas. ? Vive con alguien que tiene hepatitis B. ? Ha tenido sexo con alguien que tiene hepatitis B. ? Recibe tratamiento de hemodilisis. ? Toma ciertos medicamentos para el cncer, trasplante de rganos y afecciones autoinmunitarias. Hepatitis C  Se recomienda un anlisis de sangre para: ? Todos los que nacieron entre 1945 y 1965. ? Todas las personas que tengan un riesgo de haber contrado hepatitis C. Enfermedades de transmisin sexual (ETS).  Debe realizarse pruebas de deteccin de enfermedades de transmisin sexual (ETS), incluidas gonorrea y clamidia si: ? Es sexualmente activo y es menor de 24aos. ? Es mayor de 24aos, y el mdico le informa que corre riesgo de tener este tipo de infecciones. ? La actividad sexual ha cambiado desde que le hicieron la ltima prueba de deteccin y tiene un riesgo mayor de tener clamidia o gonorrea. Pregntele al mdico si usted   tiene riesgo.  Si no tiene el VIH, pero corre riesgo de infectarse por el virus, se recomienda tomar diariamente un medicamento recetado para evitar la infeccin. Esto se conoce como profilaxis previa a la exposicin. Se considera que est en riesgo si: ? Es [place] sexualmente y no [place] preservativos habitualmente o no conoce el estado del VIH de sus Advertising copywriter. ? Se inyecta drogas. ? Es [place] sexualmente con Ardelia Mems pareja que tiene VIH. Consulte a su mdico para saber si tiene un alto riesgo de infectarse por el VIH. Si opta por comenzar la profilaxis previa a la exposicin, primero debe realizarse anlisis de deteccin del VIH. Luego, le harn anlisis cada 54mses mientras est tomando los medicamentos para la profilaxis previa a la exposicin. E[facility] Si es premenopusica y puede quedar eArbutus solicite a su mdico asesoramiento previo a la concepcin.  Si puede quedar embarazada, tome 400 a [id number](mcg) de cido fAnheuser-Busch  Si desea evitar el embarazo, hable con su mdico sobre el  control de la natalidad (anticoncepcin). OSTEOPOROSIS Y MENOPAUSIA  La osteoporosis es una enfermedad en la que los huesos pierden los minerales y la fuerza por el avance de la edad. El resultado pueden ser fracturas graves en los hLeaf River El riesgo de osteoporosis puede identificarse con uArdelia Memsprueba de densidad sea.  Si tiene 65aos o ms, o si est en riesgo de sufrir osteoporosis y fracturas, pregunte a su mdico si debe someterse a exmenes.  Consulte a su mdico si debe tomar un suplemento de calcio o de vitamina D para reducir el riesgo de osteoporosis.  La menopausia puede presentar ciertos sntomas fsicos y rGaffer  La terapia de reemplazo hormonal puede reducir algunos de estos sntomas y rGaffer Consulte a su mdico para saber si la terapia de reemplazo hormonal es conveniente para usted. INSTRUCCIONES PARA EL CUIDADO EN EL HOGAR  Realcese los estudios de rutina de la salud, dentales y de lPublic librarian  MKennard  No consuma ningn producto que contenga tabaco, lo que incluye cigarrillos, tabaco de mHigher education careers advisero cPsychologist, sport and exercise  Si est embarazada, no beba alcohol.  Si est amamantando, reduzca el consumo de alcohol y la frecuencia con la que consume.  Si es mujer y no est embarazada limite el consumo de alcohol a no ms de 1 medida por da. Una medida equivale a 12onzas de cerveza, 5onzas de vino o 1onzas de bebidas alcohlicas de alta graduacin.  No consuma drogas.  No comparta agujas.  Solicite ayuda a su mdico si necesita apoyo o informacin para abandonar las drogas.  Informe a su mdico si a menudo se siente deprimido.  Notifique a su mdico si alguna vez ha sido vctima de abuso o si no se siente seguro en su hogar. Esta informacin no tiene cMarine scientistel consejo del mdico. Asegrese de hacerle al mdico cualquier pregunta que tenga. Document Released: 08/24/2011 Document Revised: 09/25/2014 Document Reviewed:  06/08/2015 Elsevier Interactive Patient Education  2019 [employer]

## 2018-11-11 ENCOUNTER — Ambulatory Visit: Payer: Self-pay | Attending: Family Medicine

## 2019-03-17 ENCOUNTER — Telehealth: Payer: Self-pay | Admitting: Family Medicine

## 2019-03-17 NOTE — Telephone Encounter (Signed)
Pt was informed that he is approve for CAFA and will be a letter at the [place] waiting for her

## 2019-03-17 NOTE — Telephone Encounter (Signed)
Patient called stating she hasnt received her financial letter. Please follow up.

## 2019-03-19 ENCOUNTER — Encounter: Payer: Self-pay | Admitting: Family Medicine

## 2019-03-19 ENCOUNTER — Other Ambulatory Visit: Payer: Self-pay

## 2019-03-19 ENCOUNTER — Ambulatory Visit: Payer: Self-pay | Attending: Family Medicine | Admitting: Family Medicine

## 2019-03-19 VITALS — BP 100/64 | HR 69 | Temp 98.7°F | Resp 18 | Ht 63.0 in | Wt 159.0 lb

## 2019-03-19 DIAGNOSIS — R1030 Lower abdominal pain, unspecified: Secondary | ICD-10-CM

## 2019-03-19 DIAGNOSIS — N926 Irregular menstruation, unspecified: Secondary | ICD-10-CM

## 2019-03-19 DIAGNOSIS — N644 Mastodynia: Secondary | ICD-10-CM

## 2019-03-19 DIAGNOSIS — N3001 Acute cystitis with hematuria: Secondary | ICD-10-CM

## 2019-03-19 MED ORDER — AMOXICILLIN 500 MG PO CAPS: 500.0000 mg | ORAL_CAPSULE | Freq: Two times a day (BID) | ORAL | 0 refills | Status: DC

## 2019-03-19 NOTE — Progress Notes (Signed)
Established Patient Office Visit  Subjective:  Patient ID: Caitlyn Clark, female    DOB: [birth year]  Age: 28 y.o. MRN: [id number]   Due to a language barrier, Spanish-speaking interpreter used at today's visit via Stratus video interpretation system  CC:  Chief Complaint  Patient presents with  . Breast Mass    left    HPI Caitlyn Clark presents for complaint of painful mass in the left breast that causes increased pain when she is breastfeeding.  Patient states that she has an area on her left nipple that is tender when the area is touched and she has increased pain in the area with breast-feeding.  This has occurred for the past 2 to 3 weeks.  She has seen no bloody or unusual nipple discharge.  No skin changes.  Her child was born 02/12/2018 and patient is not yet sure when she will stop breast-feeding.      She reports that she has not had her period in more than a year and her.  Recently restarted and has been present for about 2 weeks.  She has also had some mild lower abdominal pain.  She reports that she also has Nexplanon in place.  Patient would like a referral to see gynecology about her bleeding.  She reports that the lower abdominal pain is dull sometimes crampy.  She does not really feel as if she has had any urinary frequency, urgency or dysuria.  No fever or chills.  No increased back pain.  No nausea.  No dizziness or lightheadedness  Past Medical History:  Diagnosis Date  . Medical history non-contributory   . No pertinent past medical history     Past Surgical History:  Procedure Laterality Date  . CESAREAN SECTION      Family History  Problem Relation Age of Onset  . Diabetes Father   . Anesthesia problems Neg Hx     Social History   Tobacco Use  . Smoking status: Never Smoker  . Smokeless tobacco: Never Used  Substance Use Topics  . Alcohol use: No  . Drug use: No    Outpatient Medications Prior to Visit  Medication Sig Dispense  Refill  . cholecalciferol (VITAMIN D) 1000 units tablet Take 1 tablet (1,000 Units total) daily by mouth. (Patient not taking: Reported on 10/31/2018) 30 tablet 6  . colesevelam (WELCHOL) 625 MG tablet Take 2 tablets (1,250 mg total) 2 (two) times daily with a meal by mouth. (Patient not taking: Reported on 10/31/2018) 120 tablet 2  . ibuprofen (ADVIL,MOTRIN) 600 MG tablet Take 1 tablet (600 mg total) by mouth every 6 (six) hours. 30 tablet 0  . prenatal vitamin w/FE, FA (PRENATAL 1 + 1) 27-1 MG TABS Take 1 tablet by mouth daily.       No facility-administered medications prior to visit.     No Known Allergies  ROS Review of Systems  Constitutional: Positive for fatigue (Mild). Negative for chills and fever.  HENT: Negative for sore throat and trouble swallowing.   Respiratory: Negative for cough and shortness of breath.   Cardiovascular: Negative for chest pain, palpitations and leg swelling.  Gastrointestinal: Positive for abdominal pain. Negative for constipation, diarrhea, nausea and vomiting.  Endocrine: Negative for polydipsia, polyphagia and polyuria.  Genitourinary: Positive for menstrual problem. Negative for dysuria, flank pain and frequency.  Musculoskeletal: Negative for back pain and gait problem.  Neurological: Negative for dizziness, light-headedness and headaches.  Hematological: Negative for adenopathy. Does not bruise/bleed easily.  Psychiatric/Behavioral:  Negative for self-injury and suicidal ideas. The patient is not nervous/anxious.       Objective:    Physical Exam  Constitutional: She is oriented to person, place, and time. She appears well-developed and well-nourished.  Cardiovascular: Normal rate and regular rhythm.  Pulmonary/Chest: Effort normal and breath sounds normal.    Abdominal: Soft. There is abdominal tenderness (mild suprapubic tenderness to palpation). There is no rebound and no guarding.  Musculoskeletal: Normal range of motion.        General:  No tenderness or edema.     Comments: No CVA tenderness  Neurological: She is alert and oriented to person, place, and time.  Skin: Skin is warm and dry.  Psychiatric: She has a normal mood and affect. Her behavior is normal.  Nursing note and vitals reviewed.   BP 100/64 (BP Location: Left Arm, Patient Position: Sitting, Cuff Size: Normal)   Pulse 69   Temp 98.7 F (37.1 C) (Oral)   Resp 18   Ht 5\' 3"  (1.6 m)   Wt 159 lb (72.1 kg)   LMP 03/19/2019   SpO2 100%   BMI 28.17 kg/m  Wt Readings from Last 3 Encounters:  03/19/19 159 lb (72.1 kg)  10/31/18 163 lb (73.9 kg)  02/12/18 172 lb (78 kg)     Health Maintenance Due  Topic Date Due  . PAP-Cervical Cytology Screening  11/29/2014  . PAP SMEAR-Modifier  11/29/2014    There are no preventive care reminders to display for this patient.  Lab Results  Component Value Date   TSH 1.930 01/03/2017   Lab Results  Component Value Date   WBC 10.1 02/12/2018   HGB 13.7 02/12/2018   HCT 40.9 02/12/2018   MCV 92.1 02/12/2018   PLT 295 02/12/2018   Lab Results  Component Value Date   NA 140 01/03/2017   K 4.3 01/03/2017   CO2 25 01/03/2017   GLUCOSE 92 01/03/2017   BUN 7 01/03/2017   CREATININE 0.52 (L) 01/03/2017   BILITOT 0.4 01/03/2017   ALKPHOS 90 01/03/2017   AST 26 01/03/2017   ALT 33 (H) 01/03/2017   PROT 7.9 01/03/2017   ALBUMIN 4.6 01/03/2017   CALCIUM 9.5 01/03/2017   Lab Results  Component Value Date   CHOL 161 06/29/2017   Lab Results  Component Value Date   HDL 39 (L) 06/29/2017   Lab Results  Component Value Date   LDLCALC 79 06/29/2017   Lab Results  Component Value Date   TRIG 213 (H) 06/29/2017   Lab Results  Component Value Date   CHOLHDL 4.1 06/29/2017   Lab Results  Component Value Date   HGBA1C 5.3 01/03/2017      Assessment & Plan:  1. Pain of left breast Patient with complaint of left breast pain Patient will be placed on amoxicillin 500 mg twice daily x10 days in case  she has early mastitis as the cause of her breast pain.  She will also be scheduled for an ultrasound of the left breast for further evaluation of her breast pain.  If ultrasound is abnormal or if she has continued pain, diagnostic mammogram may be needed/warranted as well - US BREAST LTD UNI LEFT INC AXILLA; Future - amoxicillin (AMOXIL) 500 MG capsule; Take 1 capsule (500 mg total) by mouth 2 (two) times daily.  Dispense: 20 capsule; Refill: 0  2. Lower abdominal pain Patient with some suprapubic discomfort on examination.  She is currently on her menses which may be a  contributing factor to her abdominal pain.  Will obtain urinalysis but this will likely not be very accurate due to her ongoing menses therefore urine will also be sent for culture and patient will be notified if further treatment is needed based on the culture results.  If she has any acute worsening of abdominal pain over the July 4 holiday she may wish to go to the emergency department for further evaluation and treatment - Urine Culture - POCT URINALYSIS DIP (CLINITEK) (order removed as not yet posted and not cannot be closed without the results being posted)  3. Abnormal menses Patient with complaint of prolonged menses after not having her menses for almost 2 years as patient did not have her menses during her pregnancy with delivery in May 2019 until she had onset approximately 2 weeks ago.  Patient request GYN referral. - Ambulatory referral to Gynecology  Addendum: Patient's urine culture with growth of E. coli.  Antibiotic will be sent to patient's pharmacy for treatment of UTI.  Urinary sensitivities pending and antibiotic will be changed if needed based on final culture results.  4.  Acute cystitis Growth of E. Coli on urine culture but sensitivity pending. Rx for Keflex but her current amoxicillin for breast tenderness may also give coverage. Considered Bactrim DS x 3 days but changed since patient still breast feeding.     Follow-up: Return in about 2 weeks (around 04/02/2019).   Cain Saupeammie Morgan Keinath, MD

## 2019-03-20 MED FILL — AMOXICILLIN 500 MG CAPSULE: 500 | 10 days supply | Qty: 20 | Fill #0

## 2019-03-22 LAB — URINE CULTURE

## 2019-03-22 MED ORDER — CEPHALEXIN 500 MG PO CAPS: 500.0000 mg | ORAL_CAPSULE | Freq: Two times a day (BID) | ORAL | 0 refills | Status: AC

## 2019-03-25 ENCOUNTER — Telehealth: Payer: Self-pay | Admitting: Emergency Medicine

## 2019-03-25 NOTE — Telephone Encounter (Signed)
Nurse called the patient's home phone number but received no answer and message was left on the voicemail for the patient to call back.  Return phone number given. 

## 2019-03-25 NOTE — Telephone Encounter (Signed)
Patient called for their results please follow up °

## 2019-03-26 ENCOUNTER — Telehealth: Payer: Self-pay | Admitting: Family Medicine

## 2019-03-26 NOTE — Telephone Encounter (Signed)
Patient called returning your call . Please, call her back  Thanks

## 2019-03-26 NOTE — Telephone Encounter (Signed)
Pt called to get lab results..please follow up  °

## 2019-03-26 NOTE — Telephone Encounter (Signed)
Patient contacted via phone to be given results of labs.  Patient identified by name and date of birth.  Patient given results of labs.  Patient educated on lab results. Questions answered. Patient acknowledged understanding of labs results. 

## 2019-03-26 NOTE — Telephone Encounter (Signed)
Nurse called the patient's home phone number but received no answer and message was left on the voicemail for the patient to call back.  Return phone number given. 

## 2019-04-02 ENCOUNTER — Ambulatory Visit: Payer: Self-pay | Attending: Family Medicine | Admitting: Family Medicine

## 2019-04-02 ENCOUNTER — Other Ambulatory Visit: Payer: Self-pay

## 2019-04-02 ENCOUNTER — Encounter: Payer: Self-pay | Admitting: Family Medicine

## 2019-04-02 VITALS — BP 109/76 | HR 68 | Temp 98.5°F | Resp 16 | Wt 118.0 lb

## 2019-04-02 DIAGNOSIS — G4452 New daily persistent headache (NDPH): Secondary | ICD-10-CM

## 2019-04-02 MED ORDER — IBUPROFEN 600 MG PO TABS: 600.0000 mg | ORAL_TABLET | Freq: Four times a day (QID) | ORAL | 0 refills | Status: DC

## 2019-04-02 MED ORDER — PROMETHAZINE HCL 12.5 MG PO TABS: 12.5000 mg | ORAL_TABLET | Freq: Three times a day (TID) | ORAL | 1 refills | Status: DC | PRN

## 2019-04-02 MED ORDER — PREDNISONE 20 MG PO TABS: ORAL_TABLET | ORAL | 0 refills | Status: DC

## 2019-04-02 MED FILL — PROMETHAZINE 12.5 MG TABLET: 12.5 | 6 days supply | Qty: 20 | Fill #0

## 2019-04-02 MED FILL — predniSONE 20 MG TABS: 20 | 5 days supply | Qty: 5 | Fill #0

## 2019-04-02 MED FILL — IBUPROFEN 600 MG TABLET: 600 | 7 days supply | Qty: 30 | Fill #0

## 2019-04-02 NOTE — Progress Notes (Signed)
Established Patient Office Visit  Subjective:  Patient ID: Caitlyn Clark, female    DOB: 05/25/1991  Age: 28 y.o. MRN: [id number]  CC:  Chief Complaint  Patient presents with  . Follow-up  . Headache    HPI Caitlyn Clark presents for follow-up of breast tenderness which she states has now resolved after use of antibiotics.  She however reports that she has been getting daily headaches for about the past 2 weeks.  Headache is a pressure sensation which goes around her head and slightly down the neck on either side to the back of her head.  Headache is about a 5 on a 0-to-10 scale.  She denies any dizziness with the headache but she does have some light and noise sensitivity as well as occasional nausea with her headaches.  She denies any family history of migraines.  She has had no head trauma.  She has had no issues with her blood pressure.  She denies any changes in vision.  She has had no increased thirst, no urinary frequency.  No blurred vision or double vision.  She reports that a friend gave her a prescription dose of ibuprofen and this so far has been the only thing that seems to help her headaches go away.  She would like to have a prescription for ibuprofen as she has taken this medication in the past by prescription and it is helping with her current headaches.  Past Medical History:  Diagnosis Date  . Medical history non-contributory   . No pertinent past medical history     Past Surgical History:  Procedure Laterality Date  . CESAREAN SECTION      Family History  Problem Relation Age of Onset  . Diabetes Father   . Anesthesia problems Neg Hx    Social History   Tobacco Use  . Smoking status: Never Smoker  . Smokeless tobacco: Never Used  Substance Use Topics  . Alcohol use: No  . Drug use: No     Outpatient Medications Prior to Visit  Medication Sig Dispense Refill  . cholecalciferol (VITAMIN D) 1000 units tablet Take 1 tablet (1,000  Units total) daily by mouth. (Patient not taking: Reported on 10/31/2018) 30 tablet 6  . colesevelam (WELCHOL) 625 MG tablet Take 2 tablets (1,250 mg total) 2 (two) times daily with a meal by mouth. (Patient not taking: Reported on 10/31/2018) 120 tablet 2  . prenatal vitamin w/FE, FA (PRENATAL 1 + 1) 27-1 MG TABS Take 1 tablet by mouth daily.      Marland Kitchen. amoxicillin (AMOXIL) 500 MG capsule Take 1 capsule (500 mg total) by mouth 2 (two) times daily. (Patient not taking: Reported on 04/02/2019) 20 capsule 0  . ibuprofen (ADVIL,MOTRIN) 600 MG tablet Take 1 tablet (600 mg total) by mouth every 6 (six) hours. 30 tablet 0   No facility-administered medications prior to visit.     No Known Allergies  ROS Review of Systems  Constitutional: Positive for fatigue. Negative for chills and fever.  HENT: Negative for congestion, nosebleeds, sinus pressure, sinus pain, sore throat and trouble swallowing.   Eyes: Negative for photophobia and visual disturbance.  Respiratory: Negative for cough and shortness of breath.   Cardiovascular: Negative for palpitations and leg swelling.  Gastrointestinal: Positive for nausea. Negative for abdominal pain, constipation, diarrhea and vomiting.  Endocrine: Negative for polydipsia, polyphagia and polyuria.  Genitourinary: Negative for dysuria and frequency.  Musculoskeletal: Negative for arthralgias and back pain.  Neurological: Positive for headaches. Negative for  dizziness and numbness.  Hematological: Negative for adenopathy. Does not bruise/bleed easily.  Psychiatric/Behavioral: Negative for self-injury and suicidal ideas.      Objective:    Physical Exam  Constitutional: She appears well-developed and well-nourished.  HENT:  Head: Normocephalic and atraumatic.  Right Ear: External ear normal.  Left Ear: External ear normal.  Mouth/Throat: Oropharynx is clear and moist.  Eyes: Conjunctivae and EOM are normal.  Neck: Normal range of motion. Neck supple. No JVD  present. No thyromegaly present.  Cardiovascular: Normal rate and regular rhythm.  Pulmonary/Chest: Breath sounds normal.  Abdominal: Soft. There is no abdominal tenderness. There is no rebound and no guarding.  Musculoskeletal:        General: No tenderness or edema.     Comments: No cva tenderness  Lymphadenopathy:    She has no cervical adenopathy.  Neurological: She is alert. No cranial nerve deficit.  Skin: Skin is warm and dry.  Psychiatric: She has a normal mood and affect. Her behavior is normal. Judgment and thought content normal.  Nursing note and vitals reviewed.   BP 109/76   Pulse 68   Temp 98.5 F (36.9 C) (Oral)   Resp 16   Wt 118 lb (53.5 kg)   LMP 03/19/2019   SpO2 98%   BMI 20.90 kg/m  Wt Readings from Last 3 Encounters:  04/02/19 118 lb (53.5 kg)  03/19/19 159 lb (72.1 kg)  10/31/18 163 lb (73.9 kg)     Health Maintenance Due  Topic Date Due  . PAP-Cervical Cytology Screening  11/29/2014  . PAP SMEAR-Modifier  11/29/2014    Lab Results  Component Value Date   TSH 1.930 01/03/2017   Lab Results  Component Value Date   WBC 10.1 02/12/2018   HGB 13.7 02/12/2018   HCT 40.9 02/12/2018   MCV 92.1 02/12/2018   PLT 295 02/12/2018   Lab Results  Component Value Date   NA 140 01/03/2017   K 4.3 01/03/2017   CO2 25 01/03/2017   GLUCOSE 92 01/03/2017   BUN 7 01/03/2017   CREATININE 0.52 (L) 01/03/2017   BILITOT 0.4 01/03/2017   ALKPHOS 90 01/03/2017   AST 26 01/03/2017   ALT 33 (H) 01/03/2017   PROT 7.9 01/03/2017   ALBUMIN 4.6 01/03/2017   CALCIUM 9.5 01/03/2017   Lab Results  Component Value Date   CHOL 161 06/29/2017   Lab Results  Component Value Date   HDL 39 (L) 06/29/2017   Lab Results  Component Value Date   LDLCALC 79 06/29/2017   Lab Results  Component Value Date   TRIG 213 (H) 06/29/2017   Lab Results  Component Value Date   CHOLHDL 4.1 06/29/2017   Lab Results  Component Value Date   HGBA1C 5.3 01/03/2017       Assessment & Plan:  1. NPDH (new persistent daily headache) She reports new onset of daily headaches.  Will have patient try prednisone taper to see if this helps to break her headaches.  Prescription also provided for promethazine to take as needed for nausea associated with her headaches.  Patient requested prescription for ibuprofen as she states that this has helped decrease her headaches.  Patient was asked not to take any of the ibuprofen for the next few days while she takes the prednisone to see if this will help with her headaches.  Patient could be having rebound headaches due to recurrent use of NSAIDs for headaches.  Patient should rest and remain well-hydrated.  She should follow-up in the next 1 to 2 weeks if she is not having any improvement or resolution of her headaches. - predniSONE (DELTASONE) 20 MG tablet; Take 2 pills today then 1 pill daily for 2 days and 1/2 pill daily for 2 days; eat before taking medication  Dispense: 5 tablet; Refill: 0 - promethazine (PHENERGAN) 12.5 MG tablet; Take 1 tablet (12.5 mg total) by mouth every 8 (eight) hours as needed for nausea or vomiting.  Dispense: 20 tablet; Refill: 1 - ibuprofen (ADVIL) 600 MG tablet; Take 1 tablet (600 mg total) by mouth every 6 (six) hours. As needed for pain. Take after eating  Dispense: 30 tablet; Refill: 0   Allergies as of 04/02/2019   No Known Allergies     Medication List       Accurate as of April 02, 2019 11:59 PM. If you have any questions, ask your nurse or doctor.        STOP taking these medications   amoxicillin 500 MG capsule Commonly known as: AMOXIL Stopped by: Cain Saupeammie Eward Rutigliano, MD     TAKE these medications   cholecalciferol 1000 units tablet Commonly known as: VITAMIN D Take 1 tablet (1,000 Units total) daily by mouth.   colesevelam 625 MG tablet Commonly known as: WELCHOL Take 2 tablets (1,250 mg total) 2 (two) times daily with a meal by mouth.   ibuprofen 600 MG tablet Commonly  known as: ADVIL Take 1 tablet (600 mg total) by mouth every 6 (six) hours. As needed for pain. Take after eating What changed: additional instructions Changed by: Cain Saupeammie Alexzandra Bilton, MD   predniSONE 20 MG tablet Commonly known as: DELTASONE Take 2 pills today then 1 pill daily for 2 days and 1/2 pill daily for 2 days; eat before taking medication Started by: Cain Saupeammie Kyaire Gruenewald, MD   prenatal vitamin w/FE, FA 27-1 MG Tabs tablet Take 1 tablet by mouth daily.   promethazine 12.5 MG tablet Commonly known as: PHENERGAN Take 1 tablet (12.5 mg total) by mouth every 8 (eight) hours as needed for nausea or vomiting. Started by: Cain Saupeammie Mikia Delaluz, MD      An After Visit Summary was printed and given to the patient.  Follow-up: Return in about 2 weeks (around 04/16/2019) for if continued headaches; ED if headaches are worse.   Cain Saupeammie Alexx Mcburney, MD

## 2019-04-16 ENCOUNTER — Ambulatory Visit: Payer: Self-pay | Admitting: Family Medicine

## 2019-05-25 IMAGING — US US MFM FETAL NUCHAL TRANSLUCENCY
1 series · 15 of 28 positions shown · non-contrast
Comparison: none

[Series 1: us mfm fetal nuchal translucency · 15 of 46 slices shown]
[im 1/46]
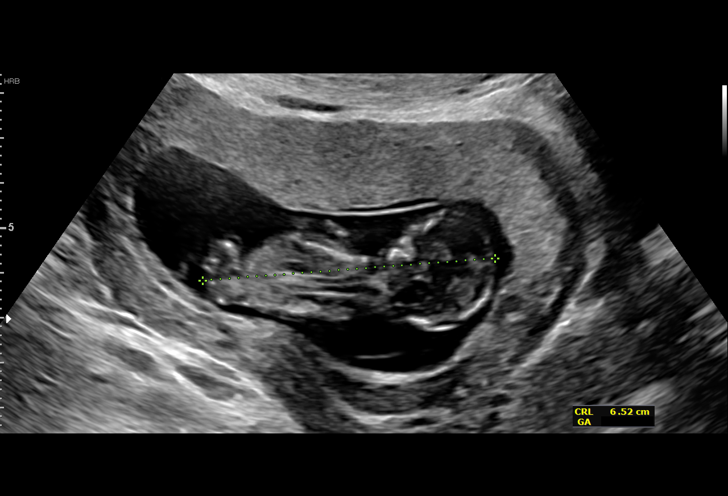
[im 4/46]
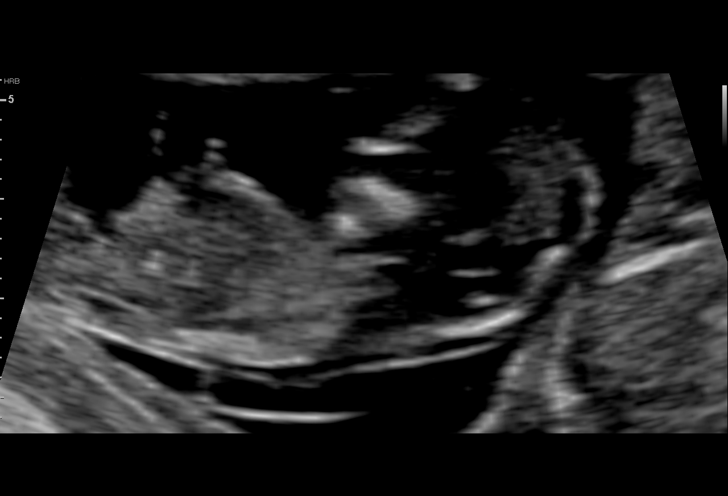
[im 7/46]
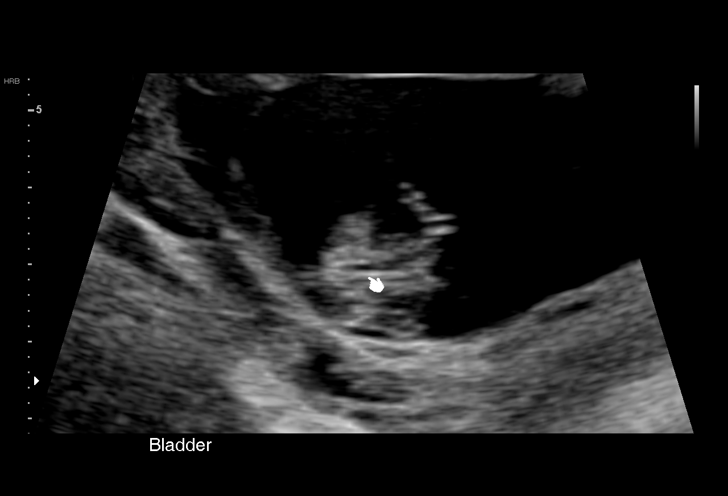
[im 11/46]
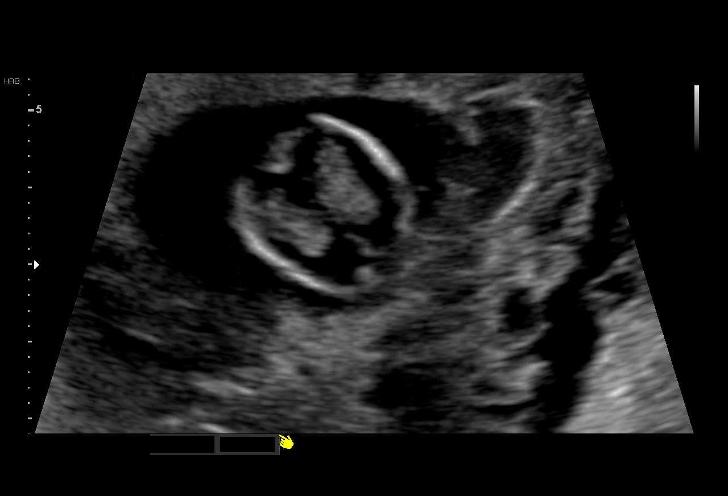
[im 14/46]
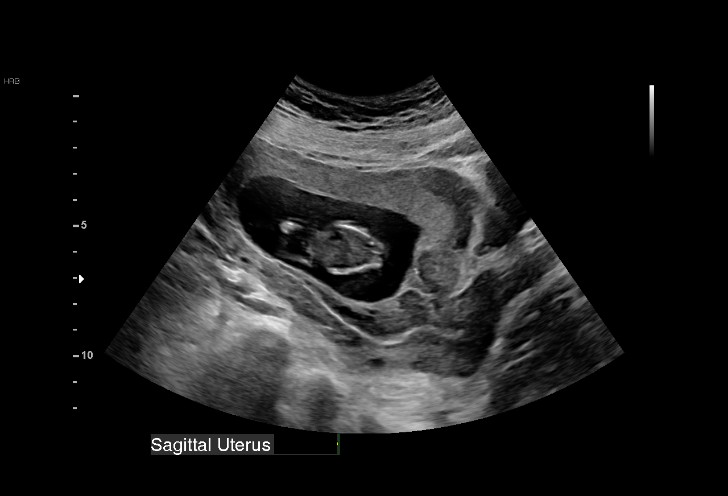
[im 17/46]
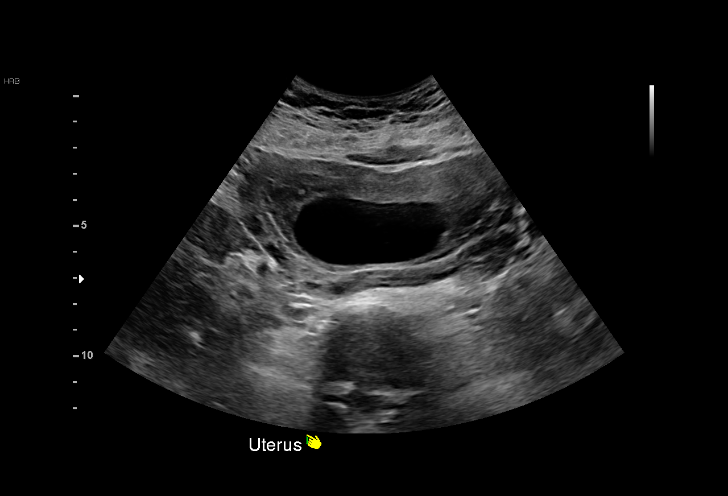
[im 21/46]
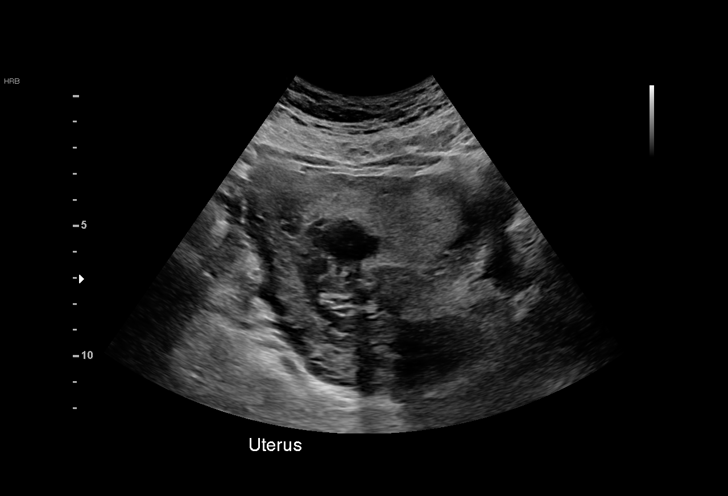
[im 24/46]
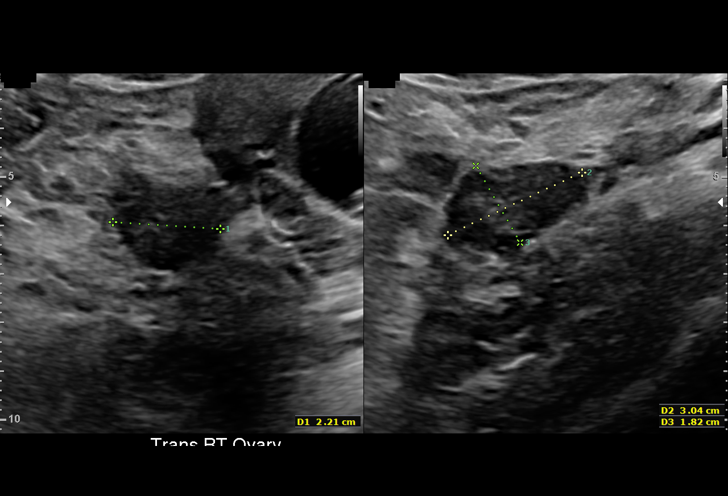
[im 26/46]
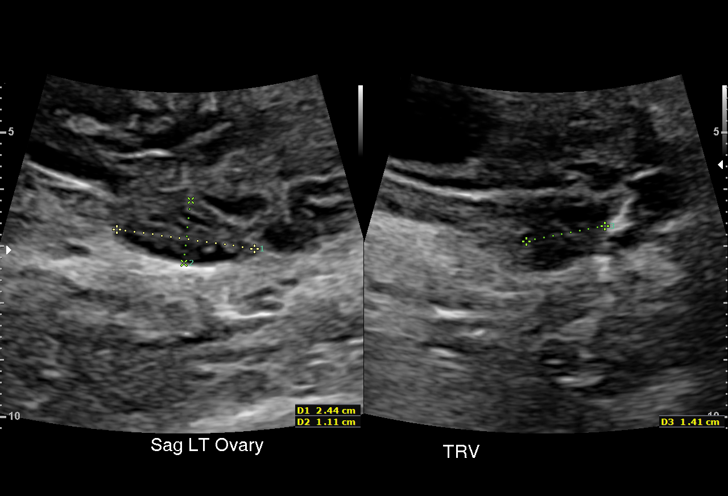
[im 29/46]
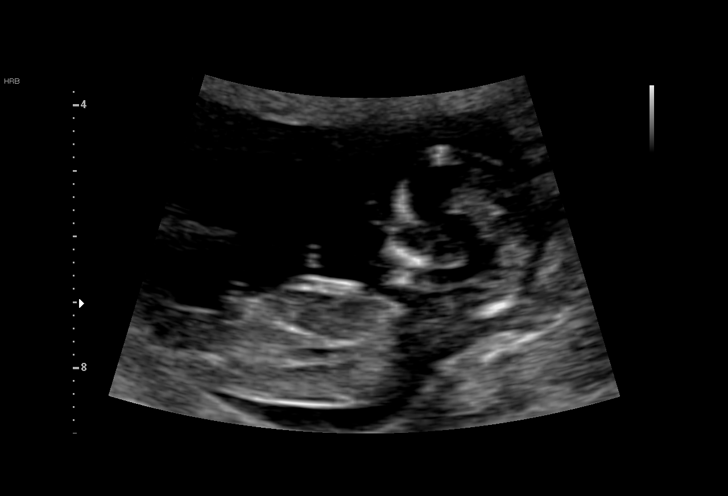
[im 32/46]
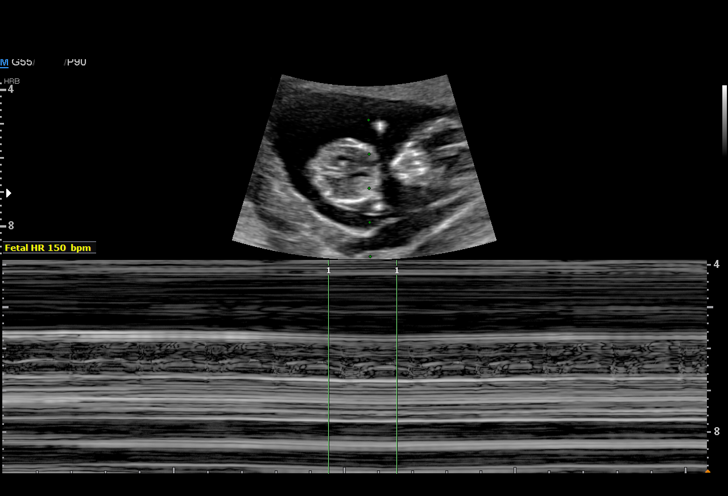
[im 36/46]
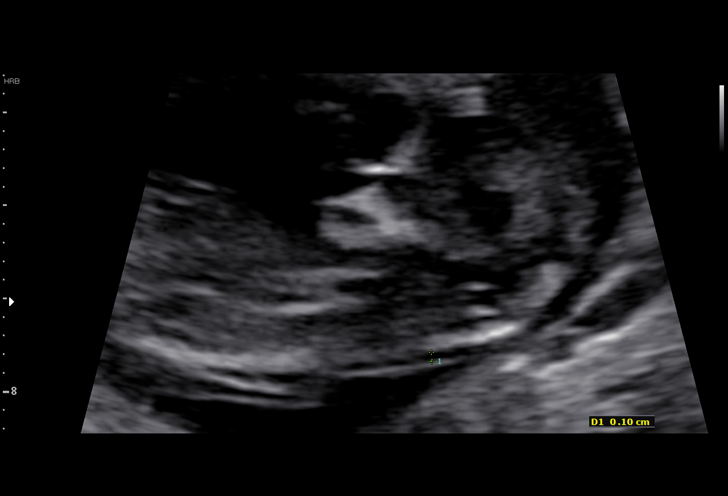
[im 39/46]
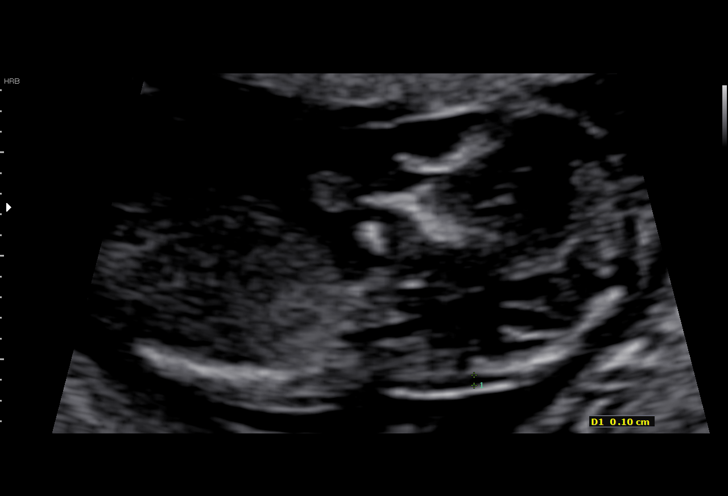
[im 42/46]
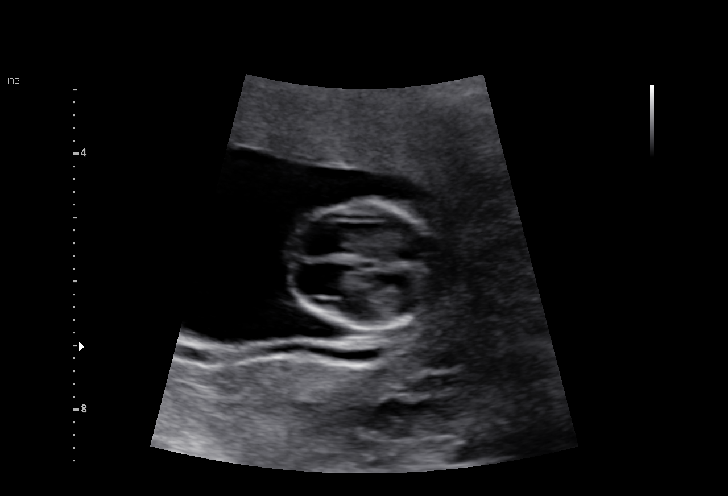
[im 46/46]
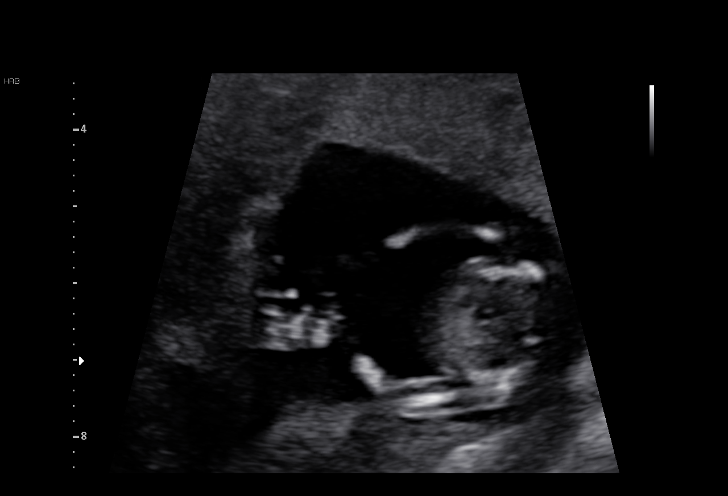

[15 of 28 positions shown; findings below may reference images not displayed]

ZEUS

YOBANY NP

TRANSLUCENCY

1  SHARATH HUFF           [id number]      [id number]     [id number]
Indications

13 weeks gestation of pregnancy
Encounter for nuchal translucency
OB History

Blood Type:            Height:  5'3"   Weight (lb):  166       BMI:
Gravidity:    4         Term:   3
Living:       3
Fetal Evaluation

Num Of Fetuses:     1
Fetal Heart         150
Rate(bpm):
Cardiac Activity:   Observed

Amniotic Fluid
AFI FV:      Subjectively within normal limits
Gestational Age

LMP:           13w 0d        Date:  05/03/17                 EDD:   02/07/18
Best:          13w 0d     Det. By:  LMP  (05/03/17)          EDD:   02/07/18
1st Trimester Genetic Sonogram Screening

CRL:            65.2  mm    G. Age:   12w 5d                 EDD:   02/09/18
Nuc Trans:       1.3  mm

Nasal Bone:                 Present
Anatomy

Cranium:               Appears normal         Bladder:                Appears normal
Choroid Plexus:        Appears normal         Upper Extremities:      Visualized
Thoracic:              Appears normal         Lower Extremities:      Visualized
Stomach:               Appears normal, left
sided
Cervix Uterus Adnexa

Cervix
Normal appearance by transabdominal scan.

Uterus
No abnormality visualized.

Left Ovary
Within normal limits.

Right Ovary
Within normal limits.

Cul De Sac:   No free fluid seen.

Adnexa:       No abnormality visualized.
Impression

SIUP at 13+0 weeks
No gross abnormalities identified
NT measurement was within normal limits for this GA; NB
present
Normal amniotic fluid volume
Measurements consistent with LMP dating
Recommendations

Offer MSAFP in the second trimester for ONTD screening
Offer detailed U/S by 18 weeks

## 2019-06-18 ENCOUNTER — Telehealth: Payer: Self-pay | Admitting: Women's Health

## 2019-06-18 NOTE — Telephone Encounter (Signed)
Spanish interpreter Eda attempted to contact patient about her appointment on 10/1 @ 10:55. Patient let Eda know that she would like to cancel the appointment. Appointment was canceled

## 2019-06-19 ENCOUNTER — Ambulatory Visit: Payer: Self-pay | Admitting: Women's Health

## 2019-07-17 ENCOUNTER — Other Ambulatory Visit: Payer: Self-pay

## 2019-07-17 ENCOUNTER — Ambulatory Visit: Payer: Self-pay | Attending: Family Medicine

## 2019-08-04 ENCOUNTER — Ambulatory Visit: Payer: Self-pay | Admitting: Family Medicine

## 2019-09-01 ENCOUNTER — Ambulatory Visit: Payer: Self-pay | Admitting: Obstetrics & Gynecology

## 2019-09-01 NOTE — Progress Notes (Deleted)
   Patient did not show up today for her scheduled appointment.   Caitlyn Monk, MD, FACOG Obstetrician & Gynecologist, [facility], [facility]  

## 2020-02-09 ENCOUNTER — Encounter: Payer: Self-pay | Admitting: Family Medicine

## 2020-02-09 ENCOUNTER — Ambulatory Visit: Payer: Self-pay | Attending: Family Medicine | Admitting: Family Medicine

## 2020-02-09 ENCOUNTER — Other Ambulatory Visit: Payer: Self-pay

## 2020-02-09 VITALS — BP 111/73 | HR 77 | Ht 63.0 in | Wt 176.8 lb

## 2020-02-09 DIAGNOSIS — Z789 Other specified health status: Secondary | ICD-10-CM

## 2020-02-09 DIAGNOSIS — M549 Dorsalgia, unspecified: Secondary | ICD-10-CM

## 2020-02-09 DIAGNOSIS — Z603 Acculturation difficulty: Secondary | ICD-10-CM

## 2020-02-09 DIAGNOSIS — Z758 Other problems related to medical facilities and other health care: Secondary | ICD-10-CM

## 2020-02-09 LAB — POCT URINALYSIS DIP (CLINITEK)
Bilirubin, UA: NEGATIVE
Blood, UA: NEGATIVE
Glucose, UA: NEGATIVE mg/dL
Ketones, POC UA: NEGATIVE mg/dL
Leukocytes, UA: NEGATIVE
Nitrite, UA: NEGATIVE
POC PROTEIN,UA: NEGATIVE
Spec Grav, UA: >=1.03 — AB
Urobilinogen, UA: 0.2 U/dL
pH, UA: 6

## 2020-02-09 MED ORDER — BACLOFEN 5 MG PO TABS: 5.0000 mg | ORAL_TABLET | Freq: Three times a day (TID) | ORAL | 0 refills | Status: DC | PRN

## 2020-02-09 MED ORDER — NAPROXEN 500 MG PO TABS: 500.0000 mg | ORAL_TABLET | Freq: Two times a day (BID) | ORAL | 1 refills | Status: DC

## 2020-02-09 MED FILL — BACLOFEN 5 MG TABS: 5 | 10 days supply | Qty: 30 | Fill #0

## 2020-02-09 MED FILL — ?NAPROXEN 500 MG TABS: 500 | 15 days supply | Qty: 30 | Fill #0

## 2020-02-09 NOTE — Progress Notes (Signed)
Established Patient Office Visit  Subjective:  Patient ID: Caitlyn Clark, female    DOB: 10-03-1990  Age: 29 y.o. MRN: [id number]  CC:  Chief Complaint  Patient presents with  . Headache  . Back Pain   Due to language barrier patient is accompanied by in person interpreter at today's visit  HPI Caitlyn Clark, 29 year old female, who presents secondary to complaint of recurrent mid to low back pain.  Pain is dull and achy.  Patient is worse with bending/lifting.  She denies any history of injury to her back.  She is not currently working but stays home full-time.  Patient does have an almost 85-year-old child that she is currently breast-feeding.  She denies any current issues with urinary frequency, urgency or dysuria.  She does have some mild fatigue but this has been chronic.  She does have a history of recurrent headaches but no recent headache issues.  Over-the-counter medication slightly helps her back pain but she has not really taken a lot of medication as she was not sure what she could take.  Past Medical History:  Diagnosis Date  . Medical history non-contributory   . No pertinent past medical history     Past Surgical History:  Procedure Laterality Date  . CESAREAN SECTION      Family History  Problem Relation Age of Onset  . Diabetes Father   . Anesthesia problems Neg Hx     Social History   Socioeconomic History  . Marital status: Married    Spouse name: Not on file  . Number of children: Not on file  . Years of education: Not on file  . Highest education level: Not on file  Occupational History  . Not on file  Tobacco Use  . Smoking status: Never Smoker  . Smokeless tobacco: Never Used  Substance and Sexual Activity  . Alcohol use: No  . Drug use: No  . Sexual activity: Yes  Other Topics Concern  . Not on file  Social History Narrative  . Not on file   Social Determinants of Health   Financial Resource Strain:   .  Difficulty of Paying Living Expenses:   Food Insecurity:   . Worried About Programme researcher, broadcasting/film/video in the Last Year:   . Barista in the Last Year:   Transportation Needs:   . Freight forwarder (Medical):   Marland Kitchen Lack of Transportation (Non-Medical):   Physical Activity:   . Days of Exercise per Week:   . Minutes of Exercise per Session:   Stress:   . Feeling of Stress :   Social Connections:   . Frequency of Communication with Friends and Family:   . Frequency of Social Gatherings with Friends and Family:   . Attends Religious Services:   . Active Member of Clubs or Organizations:   . Attends Banker Meetings:   Marland Kitchen Marital Status:   Intimate Partner Violence:   . Fear of Current or Ex-Partner:   . Emotionally Abused:   Marland Kitchen Physically Abused:   . Sexually Abused:     Outpatient Medications Prior to Visit  Medication Sig Dispense Refill  . cholecalciferol (VITAMIN D) 1000 units tablet Take 1 tablet (1,000 Units total) daily by mouth. (Patient not taking: Reported on 10/31/2018) 30 tablet 6  . colesevelam (WELCHOL) 625 MG tablet Take 2 tablets (1,250 mg total) 2 (two) times daily with a meal by mouth. (Patient not taking: Reported on 10/31/2018) 120 tablet 2  .  ibuprofen (ADVIL) 600 MG tablet Take 1 tablet (600 mg total) by mouth every 6 (six) hours. As needed for pain. Take after eating (Patient not taking: Reported on 02/09/2020) 30 tablet 0  . predniSONE (DELTASONE) 20 MG tablet Take 2 pills today then 1 pill daily for 2 days and 1/2 pill daily for 2 days; eat before taking medication (Patient not taking: Reported on 02/09/2020) 5 tablet 0  . prenatal vitamin w/FE, FA (PRENATAL 1 + 1) 27-1 MG TABS Take 1 tablet by mouth daily.      . promethazine (PHENERGAN) 12.5 MG tablet Take 1 tablet (12.5 mg total) by mouth every 8 (eight) hours as needed for nausea or vomiting. (Patient not taking: Reported on 02/09/2020) 20 tablet 1   No facility-administered medications prior to  visit.    No Known Allergies  ROS Review of Systems  Constitutional: Positive for fatigue (Mild). Negative for chills and fever.  HENT: Negative for sore throat and trouble swallowing.   Respiratory: Negative for cough and shortness of breath.   Cardiovascular: Negative for chest pain and palpitations.  Gastrointestinal: Negative for abdominal pain, constipation, diarrhea and nausea.  Endocrine: Negative for polydipsia, polyphagia and polyuria.  Genitourinary: Positive for flank pain. Negative for dysuria and frequency.  Musculoskeletal: Positive for back pain. Negative for gait problem.  Neurological: Negative for dizziness and headaches.  Hematological: Negative for adenopathy. Does not bruise/bleed easily.      Objective:    Physical Exam  Constitutional: She is oriented to person, place, and time. She appears well-developed and well-nourished.  Well-nourished well-developed overweight for height female in no acute distress, she is wearing a mask as per office COVID-19 protocol  Cardiovascular: Normal rate and regular rhythm.  Pulmonary/Chest: Effort normal and breath sounds normal.  Abdominal: Soft. There is no abdominal tenderness. There is no rebound and no guarding.  Musculoskeletal:        General: Tenderness present. No edema.     Cervical back: Normal range of motion and neck supple.     Comments: Mild bilateral CVA area/flank area discomfort with palpation with presence of mild thoracolumbar paraspinous spasm and patient with mild lumbosacral discomfort to palpation  Lymphadenopathy:    She has no cervical adenopathy.  Neurological: She is alert and oriented to person, place, and time.  Skin: Skin is warm and dry.  Psychiatric: She has a normal mood and affect. Her behavior is normal.  Vitals reviewed.   BP 111/73   Pulse 77   Ht 5\' 3"  (1.6 m)   Wt 176 lb 12.8 oz (80.2 kg)   SpO2 97%   BMI 31.32 kg/m  Wt Readings from Last 3 Encounters:  02/09/20 176 lb 12.8  oz (80.2 kg)  04/02/19 118 lb (53.5 kg)  03/19/19 159 lb (72.1 kg)     Health Maintenance Due  Topic Date Due  . COVID-19 Vaccine (1) Never done  . PAP-Cervical Cytology Screening  11/29/2014  . PAP SMEAR-Modifier  11/29/2014     Lab Results  Component Value Date   TSH 1.930 01/03/2017   Lab Results  Component Value Date   WBC 10.1 02/12/2018   HGB 13.7 02/12/2018   HCT 40.9 02/12/2018   MCV 92.1 02/12/2018   PLT 295 02/12/2018   Lab Results  Component Value Date   NA 140 01/03/2017   K 4.3 01/03/2017   CO2 25 01/03/2017   GLUCOSE 92 01/03/2017   BUN 7 01/03/2017   CREATININE 0.52 (L) 01/03/2017  BILITOT 0.4 01/03/2017   ALKPHOS 90 01/03/2017   AST 26 01/03/2017   ALT 33 (H) 01/03/2017   PROT 7.9 01/03/2017   ALBUMIN 4.6 01/03/2017   CALCIUM 9.5 01/03/2017   Lab Results  Component Value Date   CHOL 161 06/29/2017   Lab Results  Component Value Date   HDL 39 (L) 06/29/2017   Lab Results  Component Value Date   LDLCALC 79 06/29/2017   Lab Results  Component Value Date   TRIG 213 (H) 06/29/2017   Lab Results  Component Value Date   CHOLHDL 4.1 06/29/2017   Lab Results  Component Value Date   HGBA1C 5.3 01/03/2017      Assessment & Plan:  1. Mid back pain; language barrier Patient with complaint of mid back pain.  Will check urinalysis to look for possible urinary tract infection.  Discussed with patient that her back pain is likely musculoskeletal in origin. Prescriptions provided for naproxen and baclofen as per review of pharmacopeia regarding medications that are safe to take during breast-feeding.  She may also use warm moist heat to the areas of back pain.  Educational material provided in Spanish regarding low back pain.  Patient accompanied by live interpreter at today's visit to help with language barrier. - POCT URINALYSIS DIP (CLINITEK) - naproxen (NAPROSYN) 500 MG tablet; Take 1 tablet (500 mg total) by mouth 2 (two) times daily with a  meal. As needed for pain  Dispense: 30 tablet; Refill: 1 - Baclofen 5 MG TABS; Take 5 mg by mouth 3 (three) times daily between meals as needed. For muscle spasm  Dispense: 30 tablet; Refill: 0   Meds ordered this encounter  Medications  . naproxen (NAPROSYN) 500 MG tablet    Sig: Take 1 tablet (500 mg total) by mouth 2 (two) times daily with a meal. As needed for pain    Dispense:  30 tablet    Refill:  1  . Baclofen 5 MG TABS    Sig: Take 5 mg by mouth 3 (three) times daily between meals as needed. For muscle spasm    Dispense:  30 tablet    Refill:  0    Needed medication that is safe during breastfeeding    An After Visit Summary was printed and given to the patient.   Follow-up: Return for back pain- 2 or 3 weeks if not better.    Cain Saupe, MD

## 2020-02-09 NOTE — Progress Notes (Signed)
States that she is having back pain.

## 2020-02-09 NOTE — Patient Instructions (Signed)
Dolor de espalda agudo en los adultos Acute Back Pain, Adult El dolor de espalda agudo es repentino y por lo general no dura mucho tiempo. Se debe generalmente a una lesin de los msculos y tejidos de la espalda. La lesin puede ser el resultado de:  Estiramiento en exceso o desgarro (esguince) de un msculo o ligamento. Los ligamentos son tejidos que conectan los huesos. Levantar algo de forma incorrecta puede producir un esguince de espalda.  Desgaste (degeneracin) de los discos vertebrales. Los discos vertebrales son tejido circular que proporciona acolchonamiento a los huesos de la columna vertebral (vrtebras).  Movimientos de giro, como al practicar deportes o realizar trabajos de jardinera.  Un golpe en la espalda.  Artritis. Es posible que le realicen un examen fsico, anlisis de laboratorio u otros estudios de diagnstico por imgenes para encontrar la causa del dolor. El dolor de espalda agudo generalmente desaparece con reposo y cuidados en la casa. Sigue estas indicaciones en tu casa: Control del dolor, el entumecimiento y la hinchazn  Toma los medicamentos de venta libre y los recetados solamente como se lo haya indicado el mdico.  El mdico puede recomendarle que se aplique hielo durante las primeras 24a 48horas despus del comienzo del dolor. Para hacer esto: ? Ponga el hielo en una bolsa plstica. ? Coloque una toalla entre la piel y la bolsa. ? Coloque el hielo durante 20minutos, 2 a 3veces por da.  Si se lo indican, aplique calor en la zona afectada con la frecuencia que le haya indicado el mdico. Use la fuente de calor que el mdico le recomiende, como una compresa de calor hmedo o una almohadilla trmica. ? Colquese una toalla entre la piel y la fuente de calor. ? Aplique calor durante 20 a 30minutos. ? Retire la fuente de calor si la piel se pone de color rojo brillante. Esto es especialmente importante si no puede sentir dolor, calor o fro. Corre un  mayor riesgo de sufrir quemaduras. Actividad   No permanezca en la cama. Hacer reposo en la cama por ms de 1 a 2 das puede demorar su recuperacin.  Mantenga una buena postura al sentarse y pararse. No se incline hacia adelante al sentarse ni se encorve al pararse. ? Si trabaja en un escritorio, sintese cerca de este para no tener que inclinarse. Mantenga el mentn hacia abajo. Mantenga el cuello hacia atrs y los codos flexionados en ngulo recto. La posicin de los brazos debe verse como la letra "L". ? Cuando conduzca, sintese elevado y cerca del volante. Agregue un apoyo para la espalda (lumbar) al asiento del automvil, si es necesario.  Realice caminatas cortas en superficies planas tan pronto como le sea posible. Trate de caminar un poco ms de tiempo cada da.  No se siente, conduzca o permanezca de pie en un mismo lugar durante ms de 30 minutos seguidos. Pararse o sentarse durante largos perodos de tiempo puede sobrecargar la espalda.  No conduzca ni use maquinaria pesada mientras toma analgsicos recetados.  Use tcnicas apropiadas para levantar objetos. Cuando se inclina y levanta un objeto, utilice posiciones que no sobrecarguen tanto la espalda: ? Flexione las rodillas. ? Mantenga la carga cerca del cuerpo. ? No gire el cuerpo.  Haga actividad fsica habitualmente como se lo haya indicado el mdico. Hacer ejercicios ayuda a que la espalda sane ms rpido y ayuda a evitar las lesiones de la espalda al mantener los msculos fuertes y flexibles.  Trabaje con un fisioterapeuta para crear un programa de   ejercicios seguros, segn lo recomiende el mdico. Haga ejercicios como se lo haya indicado el fisioterapeuta. Estilo de vida  Mantenga un peso saludable. El sobrepeso sobrecarga la espalda y hace que resulte difcil tener una buena postura.  Evite actividades o situaciones que lo hagan sentirse ansioso o estresado. El estrs y la ansiedad aumentan la tensin muscular y  pueden empeorar el dolor de espalda. Aprenda formas de manejar la ansiedad y el estrs, como a travs del ejercicio. Indicaciones generales  Duerma sobre un colchn firme en una posicin cmoda. Intente acostarse de costado, con las rodillas ligeramente flexionadas. Si se recuesta sobre la espalda, coloque una almohada debajo de las rodillas.  Siga el plan de tratamiento como se lo haya indicado el mdico. Puede incluir: ? Terapia cognitiva o conductual. ? Acupuntura o terapia de masajes. ? Yoga o meditacin. Comuncate con un mdico si:  Siente un dolor que no se alivia con reposo o medicamentos.  Siente mucho dolor que se extiende a las piernas o las nalgas.  El dolor no mejora luego de 2 semanas.  Siente dolor por la noche.  Pierde peso sin proponrselo.  Tiene fiebre o escalofros. Solicite ayuda inmediatamente si:  Tiene nuevos problemas para controlar la vejiga o los intestinos.  Siente debilidad o adormecimiento inusuales en los brazos o en las piernas.  Siente nuseas o vmitos.  Siente dolor abdominal.  Siente que va a desmayarse. Resumen  El dolor de espalda agudo es repentino y por lo general no dura mucho tiempo.  Use tcnicas apropiadas para levantar objetos. Cuando se inclina y levanta un objeto, utilice posiciones que no sobrecarguen tanto la espalda.  Tome los medicamentos de venta libre o recetados y aplique calor o hielo solamente como se lo haya indicado el mdico. Esta informacin no tiene como fin reemplazar el consejo del mdico. Asegrese de hacerle al mdico cualquier pregunta que tenga. Document Revised: 04/12/2018 Document Reviewed: 06/21/2017 Elsevier Patient Education  2020 Elsevier Inc.   

## 2020-02-17 ENCOUNTER — Ambulatory Visit: Payer: Self-pay | Attending: Family Medicine

## 2020-02-17 ENCOUNTER — Other Ambulatory Visit: Payer: Self-pay

## 2020-03-01 ENCOUNTER — Ambulatory Visit: Payer: Self-pay | Admitting: Family Medicine

## 2020-03-05 ENCOUNTER — Ambulatory Visit: Payer: Self-pay

## 2020-03-05 ENCOUNTER — Other Ambulatory Visit: Payer: Self-pay

## 2020-09-23 ENCOUNTER — Telehealth: Payer: Self-pay | Admitting: Family Medicine

## 2020-09-23 NOTE — Telephone Encounter (Signed)
Copied from CRM [id number]. Topic: General - Other >> Sep 23, 2020  9:41 AM Wyonia Hough E wrote: Reason for CRM: Pt needs to speak with Mikle Bosworth about her orange card and renewing / Please advise

## 2020-09-23 NOTE — Telephone Encounter (Signed)
I return Pt call, Pt was inform that she need to schedule a PCP appt first before she can schedule a financial appt

## 2020-10-18 ENCOUNTER — Telehealth: Payer: Self-pay

## 2020-10-18 NOTE — Telephone Encounter (Signed)
Called patient using interpreter services LVM advising her that her appointment for 11/09/20 has been cancelled due to the provider being out of office. Advised patient to reschedule [phone].

## 2020-11-09 ENCOUNTER — Ambulatory Visit: Payer: Self-pay | Admitting: Nurse Practitioner

## 2020-11-17 ENCOUNTER — Telehealth: Payer: Self-pay

## 2020-11-17 NOTE — Telephone Encounter (Signed)
Provider out office and need to reschedule appt on 3/3. Called Pt with interpreter services to reschedule appt. Pt did not answer Vm was left for Pt to call [phone] to reshedule

## 2020-11-18 ENCOUNTER — Ambulatory Visit: Payer: Self-pay | Admitting: Physician Assistant

## 2020-12-22 ENCOUNTER — Encounter: Payer: Self-pay | Admitting: Physician Assistant

## 2020-12-22 ENCOUNTER — Other Ambulatory Visit: Payer: Self-pay

## 2020-12-22 ENCOUNTER — Ambulatory Visit: Payer: Self-pay | Attending: Physician Assistant | Admitting: Physician Assistant

## 2020-12-22 DIAGNOSIS — N926 Irregular menstruation, unspecified: Secondary | ICD-10-CM

## 2020-12-22 DIAGNOSIS — Z131 Encounter for screening for diabetes mellitus: Secondary | ICD-10-CM

## 2020-12-22 DIAGNOSIS — L299 Pruritus, unspecified: Secondary | ICD-10-CM

## 2020-12-22 DIAGNOSIS — Z13220 Encounter for screening for lipoid disorders: Secondary | ICD-10-CM

## 2020-12-22 DIAGNOSIS — M549 Dorsalgia, unspecified: Secondary | ICD-10-CM

## 2020-12-22 DIAGNOSIS — Z789 Other specified health status: Secondary | ICD-10-CM

## 2020-12-22 MED ORDER — FLUTICASONE PROPIONATE 50 MCG/ACT NA SUSP
2.0000 | Freq: Every day | NASAL | 6 refills | Status: DC
  Filled 2020-12-22: qty 16, 30d supply, fill #0

## 2020-12-22 MED ORDER — METHOCARBAMOL 500 MG PO TABS
1000.0000 mg | ORAL_TABLET | Freq: Three times a day (TID) | ORAL | 1 refills | Status: DC | PRN
  Filled 2020-12-22: qty 90, 15d supply, fill #0

## 2020-12-22 MED ORDER — CETIRIZINE HCL 10 MG PO TABS
10.0000 mg | ORAL_TABLET | Freq: Every day | ORAL | 11 refills | Status: DC
  Filled 2020-12-22: qty 30, 30d supply, fill #0

## 2020-12-22 MED ORDER — NAPROXEN 500 MG PO TABS
500.0000 mg | ORAL_TABLET | Freq: Two times a day (BID) | ORAL | 1 refills | Status: DC
  Filled 2020-12-22: qty 30, 15d supply, fill #0

## 2020-12-22 NOTE — Progress Notes (Signed)
Virtual Visit via Telephone Note  I connected with [name] on 12/22/20 at  3:30 PM EDT by telephone and verified that I am speaking with the correct person using two identifiers.  Location: Patient: home Provider: [facility] Patricio interpreting with [employer]   I discussed the limitations, risks, security and privacy concerns of performing an evaluation and management service by telephone and the availability of in person appointments. I also discussed with the patient that there may be a patient responsible charge related to this service. The patient expressed understanding and agreed to proceed.  History of Present Illness:  Patient is having itching ears B for about 1 week.  No pain.  Some congestion with allergies.  No fever.  No cough.  Also wants labs that would be done with a physical. Also needs RF for chronic lower back pain.  No new issues or concerns.    Observations/Objective:  NAD. A&Ox3   Assessment and Plan: 1. Itching - cetirizine (ZYRTEC) 10 MG tablet; Take 1 tablet (10 mg total) by mouth daily. Prn itching  Dispense: 30 tablet; Refill: 11 - fluticasone (FLONASE) 50 MCG/ACT nasal spray; Place 2 sprays into both nostrils daily.  Dispense: 16 g; Refill: 6  2. Language barrier pac[employer] interpreters used and additional time performing visit was required.   3. Abnormal menses - CBC with Differential/Platelet; Future - Comprehensive metabolic panel; Future  4. Screening for diabetes mellitus I have had a lengthy discussion and provided education about insulin resistance and the intake of too much sugar/refined carbohydrates.  I have advised the patient to work at a goal of eliminating sugary drinks, candy, desserts, sweets, refined sugars, processed foods, and white carbohydrates.  The patient expresses understanding.   - Comprehensive metabolic panel; Future - Hemoglobin A1c; Future  5. Screening cholesterol level - Comprehensive metabolic  panel; Future - Lipid panel; Future  6. Mid back pain - naproxen (NAPROSYN) 500 MG tablet; Take 1 tablet (500 mg total) by mouth 2 (two) times daily with a meal. As needed for pain  Dispense: 30 tablet; Refill: 1 - methocarbamol (ROBAXIN) 500 MG tablet; Take 2 tablets (1,000 mg total) by mouth every 8 (eight) hours as needed for muscle spasms.  Dispense: 90 tablet; Refill: 1    Follow Up Instructions: See PCP prn   I discussed the assessment and treatment plan with the patient. The patient was provided an opportunity to ask questions and all were answered. The patient agreed with the plan and demonstrated an understanding of the instructions.   The patient was advised to call back or seek an in-person evaluation if the symptoms worsen or if the condition fails to improve as anticipated.  I provided 15 minutes of non-face-to-face time during this encounter.   Georgian Co, PA-C  Patient ID: Caitlyn Clark, female   DOB: July 26, 1991, 30 y.o.   MRN: [id number]

## 2020-12-23 ENCOUNTER — Other Ambulatory Visit: Payer: Self-pay

## 2020-12-27 ENCOUNTER — Other Ambulatory Visit: Payer: Self-pay

## 2020-12-31 ENCOUNTER — Ambulatory Visit: Payer: Self-pay

## 2021-01-07 ENCOUNTER — Ambulatory Visit: Payer: Self-pay | Attending: Family Medicine

## 2021-01-07 ENCOUNTER — Other Ambulatory Visit: Payer: Self-pay

## 2021-05-12 ENCOUNTER — Telehealth: Payer: Self-pay | Admitting: *Deleted

## 2021-05-12 NOTE — Telephone Encounter (Signed)
Copied from CRM [id number]. Topic: General - Other >> May 10, 2021  1:43 PM Caitlyn Clark, Rosey Bath D wrote: Reason for CRM: Patient is calling because she would like an appt to renew her orange card. Please call patient back, thanks.

## 2021-05-13 ENCOUNTER — Telehealth: Payer: Self-pay

## 2021-05-13 NOTE — Telephone Encounter (Signed)
I return Pt call, she was inform that is too soon to reapply for the financial program since exp 07/09/21

## 2021-05-19 ENCOUNTER — Encounter: Payer: Self-pay | Admitting: Nurse Practitioner

## 2021-05-19 ENCOUNTER — Ambulatory Visit (INDEPENDENT_AMBULATORY_CARE_PROVIDER_SITE_OTHER): Payer: Self-pay | Admitting: Nurse Practitioner

## 2021-05-19 DIAGNOSIS — N926 Irregular menstruation, unspecified: Secondary | ICD-10-CM

## 2021-05-19 NOTE — Progress Notes (Signed)
Virtual Visit via Video Note  I connected with Caitlyn Clark on 05/19/21 at 11:10 AM EDT by a video enabled telemedicine application and verified that I am speaking with the correct person using two identifiers.  Location: Patient: home Provider: office   I discussed the limitations of evaluation and management by telemedicine and the availability of in person appointments. The patient expressed understanding and agreed to proceed.  History of Present Illness:  Patient presents today for an acute visit through televisit.  Spanish interpreter was used for this visit.  Patient states that she has not had a menstrual cycle in 5 weeks.  She is concerned that she may be pregnant and is requesting a pregnancy test.  We discussed that she can come to the office to have a POC pregnancy test completed.  Patient states that she does not need referral for OB/GYN. Denies f/c/s, n/v/d, hemoptysis, PND, chest pain or edema.     Observations/Objective:  Vitals with BMI 02/09/2020 04/02/2019 03/19/2019  Height 5\' 3"  - 5\' 3"   Weight 176 lbs 13 oz 118 lbs 159 lbs  BMI 31.33 20.91 28.17  Systolic 111 109  Diastolic 73 76 64  Pulse 77 68 69      Assessment and Plan:  Missed period:  Will order pregnancy test  Will call with results  Follow up:  Follow up if needed    I discussed the assessment and treatment plan with the patient. The patient was provided an opportunity to ask questions and all were answered. The patient agreed with the plan and demonstrated an understanding of the instructions.   The patient was advised to call back or seek an in-person evaluation if the symptoms worsen or if the condition fails to improve as anticipated.  I provided 23 minutes of non-face-to-face time during this encounter.   , NP

## 2021-05-19 NOTE — Patient Instructions (Signed)
Missed period:  Will order pregnancy test  Will call with results  Follow up:  Follow up if needed

## 2021-05-24 ENCOUNTER — Other Ambulatory Visit: Payer: Self-pay

## 2021-05-24 ENCOUNTER — Ambulatory Visit
Admission: EM | Admit: 2021-05-24 | Discharge: 2021-05-24 | Disposition: A | Discharge status: Home / Self Care | Payer: Self-pay

## 2021-06-05 ENCOUNTER — Ambulatory Visit (HOSPITAL_COMMUNITY)
Admission: EM | Admit: 2021-06-05 | Discharge: 2021-06-05 | Disposition: A | Discharge status: Other Acute Inpatient Hospital | Payer: Self-pay | Attending: Physician Assistant | Admitting: Physician Assistant

## 2021-06-05 ENCOUNTER — Inpatient Hospital Stay (HOSPITAL_COMMUNITY)
Admission: AD | Admit: 2021-06-05 | Discharge: 2021-06-05 | Disposition: A | Discharge status: Home / Self Care | Payer: Self-pay | Attending: Obstetrics & Gynecology | Admitting: Obstetrics & Gynecology

## 2021-06-05 ENCOUNTER — Inpatient Hospital Stay (HOSPITAL_COMMUNITY): Payer: Self-pay

## 2021-06-05 ENCOUNTER — Other Ambulatory Visit: Payer: Self-pay

## 2021-06-05 ENCOUNTER — Encounter (HOSPITAL_COMMUNITY): Payer: Self-pay | Admitting: Obstetrics & Gynecology

## 2021-06-05 DIAGNOSIS — Z3A01 Less than 8 weeks gestation of pregnancy: Secondary | ICD-10-CM

## 2021-06-05 DIAGNOSIS — O021 Missed abortion: Secondary | ICD-10-CM | POA: Insufficient documentation

## 2021-06-05 DIAGNOSIS — Z3201 Encounter for pregnancy test, result positive: Secondary | ICD-10-CM

## 2021-06-05 DIAGNOSIS — O209 Hemorrhage in early pregnancy, unspecified: Secondary | ICD-10-CM

## 2021-06-05 LAB — WET PREP, GENITAL
Clue Cells Wet Prep HPF POC: NONE SEEN
Sperm: NONE SEEN
Trich, Wet Prep: NONE SEEN
Yeast Wet Prep HPF POC: NONE SEEN

## 2021-06-05 LAB — URINALYSIS, ROUTINE W REFLEX MICROSCOPIC
Bilirubin Urine: NEGATIVE
Glucose, UA: NEGATIVE mg/dL
Ketones, ur: NEGATIVE mg/dL
Nitrite: NEGATIVE
Protein, ur: NEGATIVE mg/dL
Specific Gravity, Urine: <1.005 — ABNORMAL LOW (ref 1.005–1.030)
pH: 6.5 (ref 5.0–8.0)

## 2021-06-05 LAB — CBC
HCT: 41 % (ref 36.0–46.0)
Hemoglobin: 13.4 g/dL (ref 12.0–15.0)
MCH: 30.4 pg (ref 26.0–34.0)
MCHC: 32.7 g/dL (ref 30.0–36.0)
MCV: 93 fL (ref 80.0–100.0)
Platelets: 332 10*3/uL (ref 150–400)
RBC: 4.41 MIL/uL (ref 3.87–5.11)
RDW: 12.5 % (ref 11.5–15.5)
WBC: 7.3 10*3/uL (ref 4.0–10.5)
nRBC: 0 % (ref 0.0–0.2)

## 2021-06-05 LAB — POC URINE PREG, ED: Preg Test, Ur: POSITIVE — AB

## 2021-06-05 LAB — URINALYSIS, MICROSCOPIC (REFLEX)

## 2021-06-05 LAB — HCG, QUANTITATIVE, PREGNANCY: hCG, Beta Chain, Quant, S: 9024 m[IU]/mL — ABNORMAL HIGH (ref <5)

## 2021-06-05 MED ORDER — PROMETHAZINE HCL 25 MG PO TABS: 25.0000 mg | ORAL_TABLET | Freq: Four times a day (QID) | ORAL | 0 refills | Status: DC | PRN

## 2021-06-05 MED ORDER — MISOPROSTOL 200 MCG PO TABS
800.0000 ug | ORAL_TABLET | Freq: Once | ORAL | Status: AC
  Administered 2021-06-05: 800 ug via BUCCAL
  Filled 2021-06-05: qty 4

## 2021-06-05 MED ORDER — IBUPROFEN 800 MG PO TABS: 800.0000 mg | ORAL_TABLET | Freq: Four times a day (QID) | ORAL | 0 refills | Status: DC | PRN

## 2021-06-05 MED ORDER — IBUPROFEN 800 MG PO TABS
800.0000 mg | ORAL_TABLET | Freq: Once | ORAL | Status: AC
  Administered 2021-06-05: 800 mg via ORAL
  Filled 2021-06-05: qty 1

## 2021-06-05 MED ORDER — OXYCODONE-ACETAMINOPHEN 5-325 MG PO TABS: 1.0000 | ORAL_TABLET | ORAL | 0 refills | Status: DC | PRN

## 2021-06-05 MED ORDER — OXYCODONE-ACETAMINOPHEN 5-325 MG PO TABS
2.0000 | ORAL_TABLET | Freq: Once | ORAL | Status: AC
  Administered 2021-06-05: 2 via ORAL
  Filled 2021-06-05: qty 2

## 2021-06-05 NOTE — MAU Provider Note (Signed)
History     CSN: [id number]  Arrival date and time: 06/05/21 1058  Chief Complaint  Patient presents with   Back Pain   Vaginal Bleeding   HPI Caitlyn Clark is a 30 y.o. G5P4004 at [redacted]w[redacted]d who presents with vaginal bleeding and back pain. She reports the bleeding as spotting that is sometimes red and sometimes brown. She also reports some back pain that she rates a 3/10.    OB History     Gravida  5   Para  4   Term  4   Preterm  0   AB  0   Living  4      SAB  0   IAB  0   Ectopic  0   Multiple  0   Live Births  2           Past Medical History:  Diagnosis Date   Medical history non-contributory    No pertinent past medical history     Past Surgical History:  Procedure Laterality Date   CESAREAN SECTION      Family History  Problem Relation Age of Onset   Diabetes Father    Anesthesia problems Neg Hx     Social History   Tobacco Use   Smoking status: Never   Smokeless tobacco: Never  Vaping Use   Vaping Use: Never used  Substance Use Topics   Alcohol use: No   Drug use: No    Allergies: No Known Allergies  Medications Prior to Admission  Medication Sig Dispense Refill Last Dose   prenatal vitamin w/FE, FA (PRENATAL 1 + 1) 27-1 MG TABS Take 1 tablet by mouth daily.     06/04/2021   cetirizine (ZYRTEC) 10 MG tablet Take 1 tablet (10 mg total) by mouth daily. Prn itching 30 tablet 11    cholecalciferol (VITAMIN D) 1000 units tablet Take 1 tablet (1,000 Units total) daily by mouth. (Patient not taking: Reported on 10/31/2018) 30 tablet 6    colesevelam (WELCHOL) 625 MG tablet Take 2 tablets (1,250 mg total) 2 (two) times daily with a meal by mouth. (Patient not taking: Reported on 10/31/2018) 120 tablet 2    fluticasone (FLONASE) 50 MCG/ACT nasal spray Place 2 sprays into both nostrils daily. 16 g 6    methocarbamol (ROBAXIN) 500 MG tablet Take 2 tablets (1,000 mg total) by mouth every 8 (eight) hours as needed for muscle spasms.  90 tablet 1    naproxen (NAPROSYN) 500 MG tablet Take 1 tablet (500 mg total) by mouth 2 (two) times daily with a meal. As needed for pain 30 tablet 1     Review of Systems Physical Exam   Blood pressure 131/78, pulse 69, temperature 98.3 F (36.8 C), temperature source Oral, resp. rate 16, weight 82.7 kg, last menstrual period 04/03/2021, SpO2 100 %, unknown if currently breastfeeding.  Physical Exam  MAU Course  Procedures Results for orders placed or performed during the hospital encounter of 06/05/21 (from the past 24 hour(s))  Urinalysis, Routine w reflex microscopic Urine, Clean Catch     Status: Abnormal   Collection Time: 06/05/21 11:35 AM  Result Value Ref Range   Color, Urine YELLOW YELLOW   APPearance CLEAR CLEAR   Specific Gravity, Urine <1.005 (L) 1.005 - 1.030   pH 6.5 5.0 - 8.0   Glucose, UA NEGATIVE NEGATIVE mg/dL   Hgb urine dipstick LARGE (A) NEGATIVE   Bilirubin Urine NEGATIVE NEGATIVE   Ketones, ur NEGATIVE NEGATIVE mg/dL  Protein, ur NEGATIVE NEGATIVE mg/dL   Nitrite NEGATIVE NEGATIVE   Leukocytes,Ua TRACE (A) NEGATIVE  Urinalysis, Microscopic (reflex)     Status: Abnormal   Collection Time: 06/05/21 11:35 AM  Result Value Ref Range   RBC / HPF 0-5 0 - 5 RBC/hpf   WBC, UA 0-5 0 - 5 WBC/hpf   Bacteria, UA RARE (A) NONE SEEN   Squamous Epithelial / LPF 0-5 0 - 5  CBC     Status: None   Collection Time: 06/05/21 12:01 PM  Result Value Ref Range   WBC 7.3 4.0 - 10.5 K/uL   RBC 4.41 3.87 - 5.11 MIL/uL   Hemoglobin 13.4 12.0 - 15.0 g/dL   HCT 83.4 19.6 - 22.2 %   MCV 93.0 80.0 - 100.0 fL   MCH 30.4 26.0 - 34.0 pg   MCHC 32.7 30.0 - 36.0 g/dL   RDW 97.9 89.2 - 11.9 %   Platelets 332 150 - 400 K/uL   nRBC 0.0 0.0 - 0.2 %  hCG, quantitative, pregnancy     Status: Abnormal   Collection Time: 06/05/21 12:01 PM  Result Value Ref Range   hCG, Beta Chain, Quant, S 9,024 (H) <5 mIU/mL  ABO/Rh     Status: None   Collection Time: 06/05/21 12:01 PM  Result  Value Ref Range   ABO/RH(D)      O POS Performed at [facility], [address] [place], [place] [address]   Wet prep, genital     Status: Abnormal   Collection Time: 06/05/21  1:31 PM   Specimen: Vaginal  Result Value Ref Range   Yeast Wet Prep HPF POC NONE SEEN NONE SEEN   Trich, Wet Prep NONE SEEN NONE SEEN   Clue Cells Wet Prep HPF POC NONE SEEN NONE SEEN   WBC, Wet Prep HPF POC MANY (A) NONE SEEN   Sperm NONE SEEN     US OB LESS THAN 14 WEEKS WITH OB TRANSVAGINAL  Result Date: 06/05/2021 CLINICAL DATA:  Vaginal bleeding.  Pregnant patient. EXAM: OBSTETRIC <14 WK [place] AND TRANSVAGINAL OB US TECHNIQUE: Both transabdominal and transvaginal ultrasound examinations were performed for complete evaluation of the gestation as well as the maternal uterus, adnexal regions, and pelvic cul-de-sac. Transvaginal technique was performed to assess early pregnancy. COMPARISON:  None. FINDINGS: Intrauterine gestational sac: Single Yolk sac:  Visualized. Embryo:  Visualized. Cardiac Activity: Not Visualized. MSD:   mm    w     d CRL:  7.3 mm mm   6 w   for d                  [place] [facility]: Jan 25, 2022 Subchorionic hemorrhage:  None visualized. Maternal uterus/adnexae: A corpus luteum cyst is seen in the right ovary. The left ovary is normal. IMPRESSION: 1. An IUP is identified. No cardiac activity is seen in the embryo which demonstrates a crown-rump length of 7.3 mm. Findings meet definitive criteria for failed pregnancy. This follows SRU consensus guidelines: Diagnostic Criteria for Nonviable Pregnancy Early in the First Trimester. [facility] Mis J Med [phone]. Electronically Signed   By: Gerome Sam III M.D.   On: 06/05/2021 14:35     MDM UA, UPT CBC, HCG ABO/Rh- O Pos Wet prep and gc/chlamydia US OB Comp Less 14 weeks with Transvaginal  Discussed with patient diagnosis of missed AB. Options for management including expectant management, cytotec and surgical management discussed. Patient desires  medical management. Lengthy discussion about  expectations for pain and bleeding reviewed.   Cytotec Percocet PO Ibuprofen PO  Assessment and Plan   1. Missed abortion with fetal demise before 20 completed weeks of gestation   2. Vaginal bleeding affecting early pregnancy   3. [redacted] weeks gestation of pregnancy    -Discharge home in stable condition -Rx for  ibuprofen and percocet sent to patient's pharmacy -Abdominal pain and vaginal bleeding precautions discussed -Patient advised to follow-up with [facility] in 1 week for repeat HCG and 2 weeks with a provider, message sent -Patient may return to MAU as needed or if her condition were to change or worsen   Rolm Bookbinder CNM 06/05/2021, 2:35 PM

## 2021-06-05 NOTE — ED Triage Notes (Signed)
Pt reports vag bleeding and possible preg.

## 2021-06-05 NOTE — MAU Note (Signed)
Caitlyn Clark is a 30 y.o. at [redacted]w[redacted]d here in MAU reporting: this morning when she used the bathroom she saw some bleeding when she wiped. Is not wearing a pad. Having some pain in her back.  LMP: 04/03/2021  Onset of complaint: today  Pain score: 3/10  Vitals:   06/05/21 1117  BP: 131/78  Pulse: 69  Resp: 16  Temp: 98.3 F (36.8 C)  SpO2: 100%     Lab orders placed from triage: UA

## 2021-06-06 LAB — ABO/RH: ABO/RH(D): O POS

## 2021-06-06 LAB — GC/CHLAMYDIA PROBE AMP (~~LOC~~) NOT AT ARMC
Chlamydia: NEGATIVE
Comment: NEGATIVE
Comment: NORMAL
Neisseria Gonorrhea: NEGATIVE

## 2021-07-17 NOTE — Progress Notes (Signed)
New Patient Office Visit  Subjective:  Patient ID: Caitlyn Clark, female    DOB: [birth year]  Age: 30 y.o. MRN: [id number]  CC:  Chief Complaint  Patient presents with   Establish Care     HPI Caitlyn Clark presents for primary care to establish.  This is a former Dr. Jillyn Hidden patient.  Note the patient was in the hospital in September for a missed abortion.  Patient is HIV negative.  Patient did receive the flu vaccine on arrival.  Patient's been in quite a bit of grief after losing her baby in September.  This appeared to be a miscarriage.  Patient does complain of some dysuria.  She does need a Pap smear.  Also needs diabetes screening as her weight is up.  Patient's not on any regular medications at this time.  Patient does have some low back pain and is requesting ibuprofen for this.  She still has some vaginal bleeding Past Medical History:  Diagnosis Date   Medical history non-contributory    No pertinent past medical history     Past Surgical History:  Procedure Laterality Date   CESAREAN SECTION      Family History  Problem Relation Age of Onset   Diabetes Father    Anesthesia problems Neg Hx     Social History   Socioeconomic History   Marital status: Married    Spouse name: Not on file   Number of children: Not on file   Years of education: Not on file   Highest education level: Not on file  Occupational History   Not on file  Tobacco Use   Smoking status: Never   Smokeless tobacco: Never  Vaping Use   Vaping Use: Never used  Substance and Sexual Activity   Alcohol use: No   Drug use: No   Sexual activity: Yes  Other Topics Concern   Not on file  Social History Narrative   Not on file   Social Determinants of Health   Financial Resource Strain: Not on file  Food Insecurity: Not on file  Transportation Needs: Not on file  Physical Activity: Not on file  Stress: Not on file  Social Connections: Not on file  Intimate Partner  Violence: Not on file    ROS Review of Systems  HENT: Negative.    Respiratory: Negative.    Cardiovascular: Negative.   Gastrointestinal: Negative.   Genitourinary:  Positive for dysuria and vaginal bleeding. Negative for hematuria.  Musculoskeletal:  Positive for back pain.       Worse if sit or lay down. Mid back.  Neurological: Negative.   Psychiatric/Behavioral:  Positive for dysphoric mood. Negative for decreased concentration and suicidal ideas. The patient is not nervous/anxious.        Related to miscarriage   Objective:   Today's Vitals: BP 114/77   Pulse 73   Resp 16   Wt 182 lb 3.2 oz (82.6 kg)   LMP 04/03/2021   SpO2 96%   Breastfeeding Unknown   BMI 32.28 kg/m   Physical Exam Vitals reviewed.  Constitutional:      Appearance: Normal appearance. She is well-developed. She is obese. She is not diaphoretic.  HENT:     Head: Normocephalic and atraumatic.     Nose: No nasal deformity, septal deviation, mucosal edema or rhinorrhea.     Right Sinus: No maxillary sinus tenderness or frontal sinus tenderness.     Left Sinus: No maxillary sinus tenderness or frontal sinus tenderness.  Mouth/Throat:     Pharynx: No oropharyngeal exudate.  Eyes:     General: No scleral icterus.    Conjunctiva/sclera: Conjunctivae normal.     Pupils: Pupils are equal, round, and reactive to light.  Neck:     Thyroid: No thyromegaly.     Vascular: No carotid bruit or JVD.     Trachea: Trachea normal. No tracheal tenderness or tracheal deviation.  Cardiovascular:     Rate and Rhythm: Normal rate and regular rhythm.     Chest Wall: PMI is not displaced.     Pulses: Normal pulses. No decreased pulses.     Heart sounds: Normal heart sounds, S1 normal and S2 normal. Heart sounds not distant. No murmur heard. No systolic murmur is present.  No diastolic murmur is present.    No friction rub. No gallop. No S3 or S4 sounds.  Pulmonary:     Effort: No tachypnea, accessory muscle  usage or respiratory distress.     Breath sounds: No stridor. No decreased breath sounds, wheezing, rhonchi or rales.  Chest:     Chest wall: No tenderness.  Abdominal:     General: Bowel sounds are normal. There is no distension.     Palpations: Abdomen is soft. Abdomen is not rigid.     Tenderness: There is no abdominal tenderness. There is no guarding or rebound.  Musculoskeletal:        General: Normal range of motion.     Cervical back: Normal range of motion and neck supple. No edema, erythema or rigidity. No muscular tenderness. Normal range of motion.  Lymphadenopathy:     Head:     Right side of head: No submental or submandibular adenopathy.     Left side of head: No submental or submandibular adenopathy.     Cervical: No cervical adenopathy.  Skin:    General: Skin is warm and dry.     Coloration: Skin is not pale.     Findings: No rash.     Nails: There is no clubbing.  Neurological:     Mental Status: She is alert and oriented to person, place, and time.     Sensory: No sensory deficit.  Psychiatric:        Speech: Speech normal.        Behavior: Behavior normal.    Assessment & Plan:   Problem List Items Addressed This Visit       Other   Reactive depression - Primary    Plan referral to licensed clinical social work      Dysuria    Active burning on urination will obtain UA      Relevant Orders   Urinalysis   Vaginal bleeding    Patient missed her post hospital appointment I will reschedule with gynecology      Relevant Orders   Ambulatory referral to Gynecology   Hx of Missed abortion    History of missed abortion now with residual vaginal bleeding will need gynecologic follow-up also needs a Pap smear      Relevant Orders   Ambulatory referral to Gynecology   Encounter for health-related screening    Complete screening labs will be obtained      Relevant Orders   Lipid panel   CBC with Differential/Platelet   Comprehensive metabolic  panel   Hemoglobin A1c   Other Visit Diagnoses     Need for hepatitis C screening test       Relevant Orders   HCV Ab  w Reflex to Quant PCR   Need for immunization against influenza       Relevant Orders   Flu Vaccine QUAD 102mo+IM (Fluarix, Fluzone & Alfiuria Quad PF) (Completed)   Cervical cancer screening       Relevant Orders   Ambulatory referral to Gynecology       Outpatient Encounter Medications as of 07/18/2021  Medication Sig   ibuprofen (ADVIL) 600 MG tablet Take 1 tablet (600 mg total) by mouth every 8 (eight) hours as needed.   [DISCONTINUED] cetirizine (ZYRTEC) 10 MG tablet Take 1 tablet (10 mg total) by mouth daily. Prn itching   [DISCONTINUED] cholecalciferol (VITAMIN D) 1000 units tablet Take 1 tablet (1,000 Units total) daily by mouth. (Patient not taking: Reported on 10/31/2018)   [DISCONTINUED] fluticasone (FLONASE) 50 MCG/ACT nasal spray Place 2 sprays into both nostrils daily.   [DISCONTINUED] ibuprofen (ADVIL) 800 MG tablet Take 1 tablet (800 mg total) by mouth every 6 (six) hours as needed.   [DISCONTINUED] oxyCODONE-acetaminophen (PERCOCET/ROXICET) 5-325 MG tablet Take 1 tablet by mouth every 4 (four) hours as needed for severe pain.   [DISCONTINUED] prenatal vitamin w/FE, FA (PRENATAL 1 + 1) 27-1 MG TABS Take 1 tablet by mouth daily.     [DISCONTINUED] promethazine (PHENERGAN) 25 MG tablet Take 1 tablet (25 mg total) by mouth every 6 (six) hours as needed for nausea or vomiting.   No facility-administered encounter medications on file as of 07/18/2021.    Follow-up: Return in about 4 months (around 11/15/2021).   Shan Levans, MD

## 2021-07-18 ENCOUNTER — Other Ambulatory Visit: Payer: Self-pay

## 2021-07-18 ENCOUNTER — Encounter: Payer: Self-pay | Admitting: Critical Care Medicine

## 2021-07-18 ENCOUNTER — Ambulatory Visit: Payer: Self-pay | Attending: Critical Care Medicine | Admitting: Critical Care Medicine

## 2021-07-18 VITALS — BP 114/77 | HR 73 | Resp 16 | Wt 182.2 lb

## 2021-07-18 DIAGNOSIS — F329 Major depressive disorder, single episode, unspecified: Secondary | ICD-10-CM

## 2021-07-18 DIAGNOSIS — Z1159 Encounter for screening for other viral diseases: Secondary | ICD-10-CM

## 2021-07-18 DIAGNOSIS — R300 Dysuria: Secondary | ICD-10-CM

## 2021-07-18 DIAGNOSIS — O021 Missed abortion: Secondary | ICD-10-CM

## 2021-07-18 DIAGNOSIS — Z124 Encounter for screening for malignant neoplasm of cervix: Secondary | ICD-10-CM

## 2021-07-18 DIAGNOSIS — Z139 Encounter for screening, unspecified: Secondary | ICD-10-CM

## 2021-07-18 DIAGNOSIS — N939 Abnormal uterine and vaginal bleeding, unspecified: Secondary | ICD-10-CM | POA: Insufficient documentation

## 2021-07-18 DIAGNOSIS — Z23 Encounter for immunization: Secondary | ICD-10-CM

## 2021-07-18 HISTORY — DX: Dysuria: R30.0

## 2021-07-18 MED ORDER — IBUPROFEN 600 MG PO TABS
600.0000 mg | ORAL_TABLET | Freq: Three times a day (TID) | ORAL | 0 refills | Status: DC | PRN
  Filled 2021-07-18: qty 30, 10d supply, fill #0

## 2021-07-18 NOTE — Assessment & Plan Note (Signed)
Complete screening labs will be obtained

## 2021-07-18 NOTE — Assessment & Plan Note (Signed)
Plan referral to licensed clinical social work

## 2021-07-18 NOTE — Patient Instructions (Addendum)
Full lab screenings will be obtained today to include a urinalysis  Flu vaccine was given  Referral back to gynecology was made because of ongoing vaginal bleeding and follow-up after your miscarriage  Ibuprofen was prescribed for low back pain and follow handout for back exercises  Appointment with our clinical social worker Kathyrn Lass will be made for depression counseling over your loss  Return to Dr. Delford Field 4 months    Hoy se obtendrn exmenes de laboratorio completos para incluir un anlisis de [place].  Se administr la vacuna contra la influenza  Se hizo una remisin a Print production planner debido a un sangrado vaginal continuo y seguimiento despus de su aborto espontneo  Se recet ibuprofeno para Chief Technology Officer lumbar y siga el folleto para ejercicios de espalda  Se har una cita con nuestro trabajador social clnico [place] para recibir asesoramiento sobre depresin por su prdida.  Regreso al Dr. Delford Field 4 meses

## 2021-07-18 NOTE — Assessment & Plan Note (Signed)
Active burning on urination will obtain UA

## 2021-07-18 NOTE — Assessment & Plan Note (Signed)
History of missed abortion now with residual vaginal bleeding will need gynecologic follow-up also needs a Pap smear

## 2021-07-18 NOTE — Assessment & Plan Note (Signed)
Patient missed her post hospital appointment I will reschedule with gynecology

## 2021-07-19 ENCOUNTER — Other Ambulatory Visit: Payer: Self-pay | Admitting: Critical Care Medicine

## 2021-07-19 ENCOUNTER — Other Ambulatory Visit: Payer: Self-pay

## 2021-07-19 ENCOUNTER — Telehealth: Payer: Self-pay | Admitting: Critical Care Medicine

## 2021-07-19 LAB — COMPREHENSIVE METABOLIC PANEL
ALT: 46 IU/L — ABNORMAL HIGH (ref 0–32)
AST: 30 IU/L (ref 0–40)
Albumin/Globulin Ratio: 1.7 (ref 1.2–2.2)
Albumin: 4.7 g/dL (ref 3.8–4.8)
Alkaline Phosphatase: 93 IU/L (ref 44–121)
BUN/Creatinine Ratio: 19 (ref 9–23)
BUN: 10 mg/dL (ref 6–20)
Bilirubin Total: <0.2 mg/dL (ref 0.0–1.2)
CO2: 23 mmol/L (ref 20–29)
Calcium: 9.4 mg/dL (ref 8.7–10.2)
Chloride: 103 mmol/L (ref 96–106)
Creatinine, Ser: 0.53 mg/dL — ABNORMAL LOW (ref 0.57–1.00)
Globulin, Total: 2.8 g/dL (ref 1.5–4.5)
Glucose: 118 mg/dL — ABNORMAL HIGH (ref 70–99)
Potassium: 4.2 mmol/L (ref 3.5–5.2)
Sodium: 139 mmol/L (ref 134–144)
Total Protein: 7.5 g/dL (ref 6.0–8.5)
eGFR: 127 mL/min/{1.73_m2} (ref >59)

## 2021-07-19 LAB — URINALYSIS
Bilirubin, UA: NEGATIVE
Glucose, UA: NEGATIVE
Ketones, UA: NEGATIVE
Nitrite, UA: POSITIVE — AB
RBC, UA: NEGATIVE
Specific Gravity, UA: 1.027 (ref 1.005–1.030)
Urobilinogen, Ur: 0.2 mg/dL (ref 0.2–1.0)
pH, UA: 6.5 (ref 5.0–7.5)

## 2021-07-19 LAB — CBC WITH DIFFERENTIAL/PLATELET
Basophils Absolute: 0 10*3/uL (ref 0.0–0.2)
Basos: 0 %
EOS (ABSOLUTE): 0.2 10*3/uL (ref 0.0–0.4)
Eos: 4 %
Hematocrit: 39.4 % (ref 34.0–46.6)
Hemoglobin: 12.8 g/dL (ref 11.1–15.9)
Immature Grans (Abs): 0 10*3/uL (ref 0.0–0.1)
Immature Granulocytes: 1 %
Lymphocytes Absolute: 2 10*3/uL (ref 0.7–3.1)
Lymphs: 32 %
MCH: 29.5 pg (ref 26.6–33.0)
MCHC: 32.5 g/dL (ref 31.5–35.7)
MCV: 91 fL (ref 79–97)
Monocytes Absolute: 0.4 10*3/uL (ref 0.1–0.9)
Monocytes: 7 %
Neutrophils Absolute: 3.5 10*3/uL (ref 1.4–7.0)
Neutrophils: 56 %
Platelets: 358 10*3/uL (ref 150–450)
RBC: 4.34 x10E6/uL (ref 3.77–5.28)
RDW: 11.8 % (ref 11.7–15.4)
WBC: 6.2 10*3/uL (ref 3.4–10.8)

## 2021-07-19 LAB — LIPID PANEL
Chol/HDL Ratio: 5.6 ratio — ABNORMAL HIGH (ref 0.0–4.4)
Cholesterol, Total: 185 mg/dL (ref 100–199)
HDL: 33 mg/dL — ABNORMAL LOW (ref >39)
LDL Chol Calc (NIH): 105 mg/dL — ABNORMAL HIGH (ref 0–99)
Triglycerides: 276 mg/dL — ABNORMAL HIGH (ref 0–149)
VLDL Cholesterol Cal: 47 mg/dL — ABNORMAL HIGH (ref 5–40)

## 2021-07-19 LAB — HEMOGLOBIN A1C
Est. average glucose Bld gHb Est-mCnc: 105 mg/dL
Hgb A1c MFr Bld: 5.3 % (ref 4.8–5.6)

## 2021-07-19 LAB — HCV AB W REFLEX TO QUANT PCR: HCV Ab: <0.1 s/co ratio (ref 0.0–0.9)

## 2021-07-19 LAB — HCV INTERPRETATION

## 2021-07-19 MED ORDER — CEPHALEXIN 500 MG PO CAPS
500.0000 mg | ORAL_CAPSULE | Freq: Three times a day (TID) | ORAL | 0 refills | Status: AC
  Filled 2021-07-19: qty 15, 5d supply, fill #0

## 2021-07-19 NOTE — Telephone Encounter (Signed)
Asante  pls see this pt, severe grief reaction over loss of fetus in sept with miscarriage.

## 2021-07-19 NOTE — Telephone Encounter (Signed)
Pt was called and is aware of results and DOB was confirmed    Interpreter:Isaiah [id number]

## 2021-07-19 NOTE — Telephone Encounter (Signed)
-----   Message from Storm Frisk, MD sent at 07/19/2021  6:22 AM EDT ----- Let pt know hep C neg, urine shows bladder infection.  Blood counts kidney liver normal,  no diabetes.  I will send Antibiotic to our pharmacy

## 2021-07-25 ENCOUNTER — Other Ambulatory Visit: Payer: Self-pay

## 2021-07-25 ENCOUNTER — Ambulatory Visit: Payer: Self-pay | Attending: Critical Care Medicine

## 2021-08-04 NOTE — Telephone Encounter (Signed)
Attempted to reach pt via interpreter, no answer, unable to leave vm as vm was not set up.

## 2021-09-08 ENCOUNTER — Encounter: Payer: Self-pay | Admitting: Obstetrics and Gynecology

## 2021-09-08 ENCOUNTER — Other Ambulatory Visit: Payer: Self-pay

## 2021-09-08 ENCOUNTER — Ambulatory Visit (INDEPENDENT_AMBULATORY_CARE_PROVIDER_SITE_OTHER): Payer: Self-pay | Admitting: Obstetrics and Gynecology

## 2021-09-08 VITALS — BP 121/77 | HR 79 | Wt 179.0 lb

## 2021-09-08 DIAGNOSIS — O021 Missed abortion: Secondary | ICD-10-CM

## 2021-09-08 NOTE — Progress Notes (Signed)
°  CC: Follow up from SAB Subjective:    Patient ID: Caitlyn Clark, female    DOB: 24-Apr-1991, 30 y.o.   MRN: [id number]  HPI Pt seen for follow up of SAB.  Pt seen 06/05/21 and was diagnosed with failed pregnancy and MAB.  She was given cytotec and states she had passed most material 2 days later.  Pt bled for approximately 7 days total.  She has had spontaneous menses since completion of pregnancy and now is taking OCP acquired from [facility].  No new complaints.   Review of Systems     Objective:   Physical Exam Vitals:   09/08/21 0859  BP: 121/77  Pulse: 79         Assessment & Plan:   1. Hx of Missed abortion Per hx, SAB has been completed.   Requested pap results from [facility] annual with [facility]  I spent 10 minutes dedicated to the care of this patient including previsit review of records, face to face time with the patient discussing history, care and post visit testing.     Warden Fillers, MD Faculty Attending, Center for [facility]

## 2021-09-18 ENCOUNTER — Other Ambulatory Visit: Payer: Self-pay

## 2021-09-18 ENCOUNTER — Ambulatory Visit (HOSPITAL_COMMUNITY)
Admission: EM | Admit: 2021-09-18 | Discharge: 2021-09-18 | Disposition: A | Discharge status: Home / Self Care | Payer: Self-pay | Attending: Family Medicine | Admitting: Family Medicine

## 2021-09-18 ENCOUNTER — Encounter (HOSPITAL_COMMUNITY): Payer: Self-pay

## 2021-09-18 DIAGNOSIS — H1033 Unspecified acute conjunctivitis, bilateral: Secondary | ICD-10-CM

## 2021-09-18 MED ORDER — GENTAMICIN SULFATE 0.3 % OP SOLN
2.0000 [drp] | Freq: Three times a day (TID) | OPHTHALMIC | 0 refills | Status: DC
  Filled 2021-09-18: qty 5, 17d supply, fill #0

## 2021-09-18 MED ORDER — GENTAMICIN SULFATE 0.3 % OP SOLN: 2.0000 [drp] | Freq: Three times a day (TID) | OPHTHALMIC | 0 refills | Status: AC

## 2021-09-18 NOTE — ED Triage Notes (Signed)
Pt reports swollen lymph nodes.

## 2021-09-18 NOTE — Discharge Instructions (Addendum)
Use las gotas de antibiotico gentamicin, 3 veces al dia en cada ojo por 5 days (use the gentamycin drops 3 times daily in both eyes for 5 days).  Tome Nyquil/Dayquil cuando se necesita para nariz tapada, tos.(Take nyquil or dayquil for congestion and cough)

## 2021-09-18 NOTE — ED Triage Notes (Signed)
Pt presents with bilateral eye pain and swelling x 1 week.   States it is itchy and burning.

## 2021-09-18 NOTE — ED Provider Notes (Signed)
[facility]    CSN: [id number] Arrival date & time: 09/18/21  1213      History   Chief Complaint Chief Complaint  Patient presents with   Facial Swelling    eyes    HPI Caitlyn Clark is a 31 y.o. female.   HPI Here for 7 days of redness and eye dc bilaterally. Then began having congestion and rhinorrhea and cough 09/15/21. No f/c/n/v/d.  LMP 08/28/21.  Past Medical History:  Diagnosis Date   Medical history non-contributory    No pertinent past medical history     Patient Active Problem List   Diagnosis Date Noted   Reactive depression 07/18/2021   Dysuria 07/18/2021   Vaginal bleeding 07/18/2021   Hx of Missed abortion 07/18/2021   Encounter for health-related screening 07/18/2021    Past Surgical History:  Procedure Laterality Date   CESAREAN SECTION      OB History     Gravida  6   Para  4   Term  4   Preterm  0   AB  1   Living  4      SAB  0   IAB  0   Ectopic  0   Multiple  0   Live Births  2            Home Medications    Prior to Admission medications   Medication Sig Start Date End Date Taking? Authorizing Provider  gentamicin (GARAMYCIN) 0.3 % ophthalmic solution Place 2 drops into both eyes 3 (three) times daily for 5 days. 09/18/21 09/23/21  Zenia Resides, MD  ibuprofen (ADVIL) 600 MG tablet Take 1 tablet (600 mg total) by mouth every 8 (eight) hours as needed. 07/18/21   Storm Frisk, MD    Family History Family History  Problem Relation Age of Onset   Diabetes Father    Anesthesia problems Neg Hx     Social History Social History   Tobacco Use   Smoking status: Never   Smokeless tobacco: Never  Vaping Use   Vaping Use: Never used  Substance Use Topics   Alcohol use: No   Drug use: No     Allergies   Patient has no known allergies.   Review of Systems Review of Systems   Physical Exam Triage Vital Signs ED Triage Vitals  Enc Vitals Group     BP 09/18/21 1430  124/76     Pulse Rate 09/18/21 1429 72     Resp 09/18/21 1429 17     Temp 09/18/21 1430 98.5 F (36.9 C)     Temp Source 09/18/21 1430 Oral     SpO2 09/18/21 1429 98 %     Weight --      Height --      Head Circumference --      Peak Flow --      Pain Score 09/18/21 1428 6     Pain Loc --      Pain Edu? --      Excl. in GC? --    No data found.  Updated Vital Signs BP 124/76 (BP Location: Right Arm)    Pulse 72    Temp 98.5 F (36.9 C) (Oral)    Resp 17    LMP 08/27/2021 (Exact Date)    SpO2 98%   Visual Acuity Right Eye Distance:   Left Eye Distance:   Bilateral Distance:    Right Eye Near:   Left Eye Near:  Bilateral Near:     Physical Exam   UC Treatments / Results  Labs (all labs ordered are listed, but only abnormal results are displayed) Labs Reviewed - No data to display  EKG   Radiology No results found.  Procedures Procedures (including critical care time)  Medications Ordered in UC Medications - No data to display  Initial Impression / Assessment and Plan / UC Course  I have reviewed the triage vital signs and the nursing notes.  Pertinent labs & imaging results that were available during my care of the patient were reviewed by me and considered in my medical decision making (see chart for details).     Symptomatic tx for the URI symptoms, and gent for the conjunctivitis Final Clinical Impressions(s) / UC Diagnoses   Final diagnoses:  Acute conjunctivitis of both eyes, unspecified acute conjunctivitis type     Discharge Instructions      Use las gotas de antibiotico gentamicin, 3 veces al dia en cada ojo por 5 days (use the gentamycin drops 3 times daily in both eyes for 5 days).  Tome Nyquil/Dayquil cuando se necesita para nariz tapada, tos.(Take nyquil or dayquil for congestion and cough)     ED Prescriptions     Medication Sig Dispense Auth. Provider   gentamicin (GARAMYCIN) 0.3 % ophthalmic solution  (Status: Discontinued)  Place 2 drops into both eyes 3 (three) times daily for 5 days. 5 mL Zenia Resides, MD   gentamicin (GARAMYCIN) 0.3 % ophthalmic solution Place 2 drops into both eyes 3 (three) times daily for 5 days. 5 mL Zenia Resides, MD      PDMP not reviewed this encounter.   Zenia Resides, MD 09/18/21 [phone]

## 2021-09-19 ENCOUNTER — Other Ambulatory Visit: Payer: Self-pay

## 2021-09-23 NOTE — Telephone Encounter (Signed)
I spoke with pt via spanish interpreter and scheduled appt for 10/10/21 at 3:30

## 2021-09-27 ENCOUNTER — Ambulatory Visit: Payer: Self-pay | Admitting: Critical Care Medicine

## 2021-10-10 ENCOUNTER — Other Ambulatory Visit: Payer: Self-pay

## 2021-10-10 ENCOUNTER — Ambulatory Visit: Payer: Self-pay | Attending: Critical Care Medicine | Admitting: Clinical

## 2021-10-10 DIAGNOSIS — F411 Generalized anxiety disorder: Secondary | ICD-10-CM

## 2021-10-10 DIAGNOSIS — F331 Major depressive disorder, recurrent, moderate: Secondary | ICD-10-CM
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
## 2021-11-02 ENCOUNTER — Other Ambulatory Visit: Payer: Self-pay | Admitting: Pharmacist

## 2021-11-02 ENCOUNTER — Ambulatory Visit: Payer: Self-pay | Attending: Critical Care Medicine | Admitting: Critical Care Medicine

## 2021-11-02 ENCOUNTER — Encounter: Payer: Self-pay | Admitting: Critical Care Medicine

## 2021-11-02 ENCOUNTER — Other Ambulatory Visit: Payer: Self-pay

## 2021-11-02 ENCOUNTER — Ambulatory Visit (HOSPITAL_BASED_OUTPATIENT_CLINIC_OR_DEPARTMENT_OTHER): Payer: Self-pay | Admitting: Clinical

## 2021-11-02 VITALS — BP 114/73 | HR 71 | Ht 63.0 in | Wt 186.6 lb

## 2021-11-02 DIAGNOSIS — R300 Dysuria: Secondary | ICD-10-CM

## 2021-11-02 DIAGNOSIS — F331 Major depressive disorder, recurrent, moderate: Secondary | ICD-10-CM

## 2021-11-02 DIAGNOSIS — N644 Mastodynia: Secondary | ICD-10-CM

## 2021-11-02 DIAGNOSIS — M62838 Other muscle spasm: Secondary | ICD-10-CM | POA: Insufficient documentation

## 2021-11-02 DIAGNOSIS — F411 Generalized anxiety disorder: Secondary | ICD-10-CM

## 2021-11-02 DIAGNOSIS — N939 Abnormal uterine and vaginal bleeding, unspecified: Secondary | ICD-10-CM

## 2021-11-02 MED ORDER — IBUPROFEN 600 MG PO TABS
600.0000 mg | ORAL_TABLET | Freq: Three times a day (TID) | ORAL | 0 refills | Status: DC | PRN
  Filled 2021-11-02: qty 30, 10d supply, fill #0

## 2021-11-02 MED ORDER — DICLOFENAC SODIUM 1 % EX GEL
2.0000 g | Freq: Four times a day (QID) | CUTANEOUS | 0 refills | Status: DC
  Filled 2021-11-02: qty 100, 12d supply, fill #0

## 2021-11-02 MED ORDER — DICLOFENAC SODIUM 1 % EX GEL
2.0000 g | Freq: Four times a day (QID) | CUTANEOUS | 0 refills | Status: DC
  Filled 2021-11-02: qty 50, fill #0
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
## 2021-11-02 NOTE — Assessment & Plan Note (Signed)
Patient has trapezius muscle spasm on the left side I gave her ibuprofen 600 mg 3 times daily as needed and also gave her back exercises and topical Voltaren gel

## 2021-11-02 NOTE — Assessment & Plan Note (Signed)
Left breast pain with no abnormality seen will observe for now

## 2021-11-02 NOTE — Patient Instructions (Addendum)
Do back exercises  Use voltaren gel to left shoulder and left side of neck 2-3 times a day  Use ibuprofen three times a day as needed for pain  Return Dr Delford Field 6 months   Haz ejercicios de espalda  Use gel voltaren en el hombro izquierdo y en el lado izquierdo del cuello 2 o 3 veces al da  Use ibuprofeno tres veces al da segn sea necesario para Horticulturist, commercial Dr. Delford Field 6 meses
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
## 2021-11-02 NOTE — Assessment & Plan Note (Signed)
This has resolved.

## 2021-11-02 NOTE — Assessment & Plan Note (Signed)
This is resolved

## 2021-11-03 ENCOUNTER — Other Ambulatory Visit: Payer: Self-pay

## 2021-11-15 ENCOUNTER — Ambulatory Visit: Payer: Self-pay | Admitting: Critical Care Medicine

## 2021-11-23 ENCOUNTER — Ambulatory Visit: Payer: Self-pay | Admitting: Clinical

## 2021-12-19 ENCOUNTER — Ambulatory Visit: Payer: Self-pay | Attending: Critical Care Medicine | Admitting: Clinical

## 2021-12-19 DIAGNOSIS — F411 Generalized anxiety disorder: Secondary | ICD-10-CM

## 2021-12-19 DIAGNOSIS — F331 Major depressive disorder, recurrent, moderate: Secondary | ICD-10-CM
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
## 2022-01-02 ENCOUNTER — Ambulatory Visit: Payer: Self-pay

## 2022-01-02 NOTE — Telephone Encounter (Signed)
Pt called to report that she has suffered from ear pain and swollen tonsils for 2 weeks. Seeking an appt as soon as possible  ? ?Best contact: [phone]  ? ?Needs spanish interpreter   ?  ?Used Spanish interpreter # [phone] ?Chief Complaint: Sore throat, bilateral ear pain ?Symptoms: Above  ?Frequency: Started 2 weeks ago ?Pertinent Negatives: Patient denies fever ?Disposition: [] ED /[] Urgent Care (no appt availability in office) / [] Appointment(In office/virtual)/ []  [facility]/ [] Home Care/ [] Refused Recommended Disposition /[] [facility]/ []  Follow-up with PCP ?Additional Notes: Pt. Asking to be worked in. Please advise.  ?Answer Assessment - Initial Assessment Questions ?1. ONSET: "When did the throat start hurting?" (Hours or days ago)  ?    2 weeks ago ?2. SEVERITY: "How bad is the sore throat?" (Scale 1-10; mild, moderate or severe) ?  - MILD (1-3):  doesn't interfere with eating or normal activities ?  - MODERATE (4-7): interferes with eating some solids and normal activities ?  - SEVERE (8-10):  excruciating pain, interferes with most normal activities ?  - SEVERE DYSPHAGIA: can't swallow liquids, drooling ?    Moderate ?3. STREP EXPOSURE: "Has there been any exposure to strep within the past week?" If Yes, ask: "What type of contact occurred?"  ?    No ?4.  VIRAL SYMPTOMS: "Are there any symptoms of a cold, such as a runny nose, cough, hoarse voice or red eyes?"  ?    Ear pain - left ?5. FEVER: "Do you have a fever?" If Yes, ask: "What is your temperature, how was it measured, and when did it start?" ?    No ?6. PUS ON THE TONSILS: "Is there pus on the tonsils in the back of your throat?" ?    No ?7. OTHER SYMPTOMS: "Do you have any other symptoms?" (e.g., difficulty breathing, headache, rash) ?    No ?8. PREGNANCY: "Is there any chance you are pregnant?" "When was your last menstrual period?" ?    No ? ?Protocols used: Sore Throat-A-AH ? ?

## 2022-01-02 NOTE — Telephone Encounter (Addendum)
Apt scheduled with [facility], [facility] operations at 1520.  ? ?Interpreter assistance provided by [facility]- [phone] ?

## 2022-01-04 ENCOUNTER — Ambulatory Visit: Payer: Self-pay | Admitting: Physician Assistant

## 2022-01-09 ENCOUNTER — Ambulatory Visit: Payer: Self-pay | Attending: Critical Care Medicine | Admitting: Clinical

## 2022-01-09 DIAGNOSIS — F331 Major depressive disorder, recurrent, moderate: Secondary | ICD-10-CM

## 2022-01-09 DIAGNOSIS — F411 Generalized anxiety disorder: Secondary | ICD-10-CM
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
## 2022-01-09 NOTE — Patient Instructions (Signed)
[facility]  ?[address], [place], [place] [address] [phone] or [phone] ?WALK-IN URGENT CARE 24/7 FOR ANYONE ?[address], [place],   [phone] ?Fax: [phone] [email or web address] ?*Interpreters available ?*Accepts all insurance and uninsured for Urgent Care needs ?*Accepts Medicaid and uninsured for outpatient treatment  ?  ? ?ONLY FOR [place] Residents ? ?New patient assessment and therapy walk-ins ?Mondays and Wednesdays 8am-11am ?First and second Fridays 1pm-5pm ? ?New patient psychiatry and medication management walk-ins:  ?Mondays, Wednesdays, Thursdays, Fridays 8am -11am ?NO PSYCHIATRY WALK-INS on TUESDAYS ?  ?

## 2022-01-13 ENCOUNTER — Telehealth: Payer: Self-pay | Admitting: Clinical

## 2022-01-13 DIAGNOSIS — F329 Major depressive disorder, single episode, unspecified: Secondary | ICD-10-CM

## 2022-01-13 NOTE — Telephone Encounter (Signed)
Hey Dr. Delford Field can you submit a formal referral to [facility]? I gave her walk in information but I doubt she will go. ?

## 2022-01-14 NOTE — Telephone Encounter (Signed)
Psychiatry ref sent ?

## 2022-01-14 NOTE — Addendum Note (Signed)
Addended by: Storm Frisk on: 01/14/2022 06:03 AM ? ? Modules accepted: Orders ? ?
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
## 2023-01-30 ENCOUNTER — Telehealth: Payer: Self-pay | Admitting: Critical Care Medicine

## 2023-01-30 ENCOUNTER — Other Ambulatory Visit: Payer: Self-pay

## 2023-01-30 ENCOUNTER — Ambulatory Visit: Payer: Self-pay | Attending: Family Medicine

## 2023-01-30 ENCOUNTER — Ambulatory Visit: Payer: Self-pay | Attending: Critical Care Medicine | Admitting: Critical Care Medicine

## 2023-01-30 VITALS — BP 121/79 | HR 72 | Ht 63.25 in | Wt 193.6 lb

## 2023-01-30 DIAGNOSIS — M62838 Other muscle spasm: Secondary | ICD-10-CM

## 2023-01-30 DIAGNOSIS — F329 Major depressive disorder, single episode, unspecified: Secondary | ICD-10-CM

## 2023-01-30 DIAGNOSIS — K029 Dental caries, unspecified: Secondary | ICD-10-CM

## 2023-01-30 DIAGNOSIS — Z139 Encounter for screening, unspecified: Secondary | ICD-10-CM

## 2023-01-30 DIAGNOSIS — R5383 Other fatigue: Secondary | ICD-10-CM | POA: Insufficient documentation

## 2023-01-30 DIAGNOSIS — N644 Mastodynia: Secondary | ICD-10-CM

## 2023-01-30 MED ORDER — DICLOFENAC SODIUM 1 % EX GEL
2.0000 g | Freq: Four times a day (QID) | CUTANEOUS | 0 refills | Status: DC
  Filled 2023-01-30: qty 100, 12d supply, fill #0

## 2023-01-30 MED ORDER — METHOCARBAMOL 500 MG PO TABS
500.0000 mg | ORAL_TABLET | Freq: Four times a day (QID) | ORAL | 0 refills | Status: DC | PRN
  Filled 2023-01-30 (×2): qty 40, 10d supply, fill #0

## 2023-01-30 NOTE — Assessment & Plan Note (Signed)
Continued upper back pain will prescribe Robaxin as needed continue with over-the-counter ibuprofen and give topical diclofenac and also gave back exercises

## 2023-01-30 NOTE — Assessment & Plan Note (Addendum)
Stabilize mental health

## 2023-01-30 NOTE — Telephone Encounter (Signed)
Patient is wanting to get a referral to a dentist.

## 2023-01-30 NOTE — Addendum Note (Signed)
Addended by: Storm Frisk on: 01/30/2023 05:56 PM   Modules accepted: Orders

## 2023-01-30 NOTE — Assessment & Plan Note (Signed)
Complete health screenings obtained

## 2023-01-30 NOTE — Patient Instructions (Addendum)
Begin trial of methocarbamol take one 4 times a day as needed for back spasm Resume diclofenac gel applied to both upper back shoulder area 2-3 times a day Use ibuprofen over-the-counter take 3 of these 200 mg tablets 3 times a day as needed for back pain Follow back exercises as attached  Complete screening labs will be obtained and we will call you results  Return to Dr. Delford Field 4 months  Comience la prueba de metocarbamol, tome uno 4 veces al da segn sea necesario para el espasmo de espalda. Reanude la aplicacin del gel de diclofenaco en la parte superior de la espalda y los hombros 2 o 3 veces al Futures trader. Use ibuprofeno sin receta, tome 3 de estas tabletas de 200 mg 3 veces al da segn sea necesario para el dolor de espalda. Siga los ejercicios de espalda adjuntos.  Se obtendrn laboratorios de deteccin completos y Photographer con los [place].

## 2023-01-30 NOTE — Assessment & Plan Note (Signed)
Check thyroid function 

## 2023-01-30 NOTE — Assessment & Plan Note (Signed)
Breast exam is normal will monitor

## 2023-01-31 ENCOUNTER — Other Ambulatory Visit: Payer: Self-pay

## 2023-02-01 LAB — COMPREHENSIVE METABOLIC PANEL
ALT: 57 IU/L — ABNORMAL HIGH (ref 0–32)
AST: 39 IU/L (ref 0–40)
Albumin/Globulin Ratio: 1.3 (ref 1.2–2.2)
Albumin: 4.4 g/dL (ref 3.9–4.9)
Alkaline Phosphatase: 100 IU/L (ref 44–121)
BUN/Creatinine Ratio: 16 (ref 9–23)
BUN: 9 mg/dL (ref 6–20)
Bilirubin Total: <0.2 mg/dL (ref 0.0–1.2)
CO2: 20 mmol/L (ref 20–29)
Calcium: 9.3 mg/dL (ref 8.7–10.2)
Chloride: 104 mmol/L (ref 96–106)
Creatinine, Ser: 0.58 mg/dL (ref 0.57–1.00)
Globulin, Total: 3.3 g/dL (ref 1.5–4.5)
Glucose: 118 mg/dL — ABNORMAL HIGH (ref 70–99)
Potassium: 4.1 mmol/L (ref 3.5–5.2)
Sodium: 140 mmol/L (ref 134–144)
Total Protein: 7.7 g/dL (ref 6.0–8.5)
eGFR: 123 mL/min/{1.73_m2} (ref >59)

## 2023-02-01 LAB — LIPID PANEL
Chol/HDL Ratio: 7.7 ratio — ABNORMAL HIGH (ref 0.0–4.4)
Cholesterol, Total: 201 mg/dL — ABNORMAL HIGH (ref 100–199)
HDL: 26 mg/dL — ABNORMAL LOW (ref >39)
LDL Chol Calc (NIH): 118 mg/dL — ABNORMAL HIGH (ref 0–99)
Triglycerides: 325 mg/dL — ABNORMAL HIGH (ref 0–149)
VLDL Cholesterol Cal: 57 mg/dL — ABNORMAL HIGH (ref 5–40)

## 2023-02-01 LAB — CBC WITH DIFFERENTIAL/PLATELET
Basophils Absolute: 0 10*3/uL (ref 0.0–0.2)
Basos: 1 %
EOS (ABSOLUTE): 0.2 10*3/uL (ref 0.0–0.4)
Eos: 3 %
Hematocrit: 43.5 % (ref 34.0–46.6)
Hemoglobin: 13.8 g/dL (ref 11.1–15.9)
Immature Grans (Abs): 0 10*3/uL (ref 0.0–0.1)
Immature Granulocytes: 0 %
Lymphocytes Absolute: 2.3 10*3/uL (ref 0.7–3.1)
Lymphs: 32 %
MCH: 30.4 pg (ref 26.6–33.0)
MCHC: 31.7 g/dL (ref 31.5–35.7)
MCV: 96 fL (ref 79–97)
Monocytes Absolute: 0.4 10*3/uL (ref 0.1–0.9)
Monocytes: 6 %
Neutrophils Absolute: 4.1 10*3/uL (ref 1.4–7.0)
Neutrophils: 58 %
Platelets: 353 10*3/uL (ref 150–450)
RBC: 4.54 x10E6/uL (ref 3.77–5.28)
RDW: 12.9 % (ref 11.7–15.4)
WBC: 7.1 10*3/uL (ref 3.4–10.8)

## 2023-02-01 LAB — HEMOGLOBIN A1C
Est. average glucose Bld gHb Est-mCnc: 120 mg/dL
Hgb A1c MFr Bld: 5.8 % — ABNORMAL HIGH (ref 4.8–5.6)

## 2023-02-01 LAB — THYROID PANEL WITH TSH
Free Thyroxine Index: 2.1 (ref 1.2–4.9)
T3 Uptake Ratio: 27 % (ref 24–39)
T4, Total: 7.7 ug/dL (ref 4.5–12.0)
TSH: 3.21 u[IU]/mL (ref 0.450–4.500)

## 2023-02-05 ENCOUNTER — Other Ambulatory Visit: Payer: Self-pay

## 2023-02-06 ENCOUNTER — Telehealth: Payer: Self-pay

## 2023-02-06 NOTE — Telephone Encounter (Signed)
Copied from CRM [id number]. Topic: General - Other >> Feb 06, 2023  2:47 PM Jannifer Rodney M wrote: Reason for CRM: Pt requests lab results. Cb# [phone]

## 2023-02-08 NOTE — Telephone Encounter (Signed)
Labs are in but no notation

## 2023-02-08 NOTE — Telephone Encounter (Signed)
Labs all normal but high cholesterol work on healthy low fat diet

## 2023-02-09 NOTE — Telephone Encounter (Signed)
Called patient and DOB was confirmed, labs given   Interpreter id [phone]

## 2023-03-28 IMAGING — US US OB < 14 WEEKS - US OB TV
1 series · 15 of 28 positions shown · non-contrast
Comparison: None.

CLINICAL DATA: Vaginal bleeding.  Pregnant patient.

EXAM:
OBSTETRIC <14 WK US AND TRANSVAGINAL OB US
TECHNIQUE: Both transabdominal and transvaginal ultrasound examinations were
performed for complete evaluation of the gestation as well as the
maternal uterus, adnexal regions, and pelvic cul-de-sac.
Transvaginal technique was performed to assess early pregnancy.

[Series 1: us ob < 14 weeks - us ob tv · 44 acquisitions, 15 frames shown]
[im 1/44]
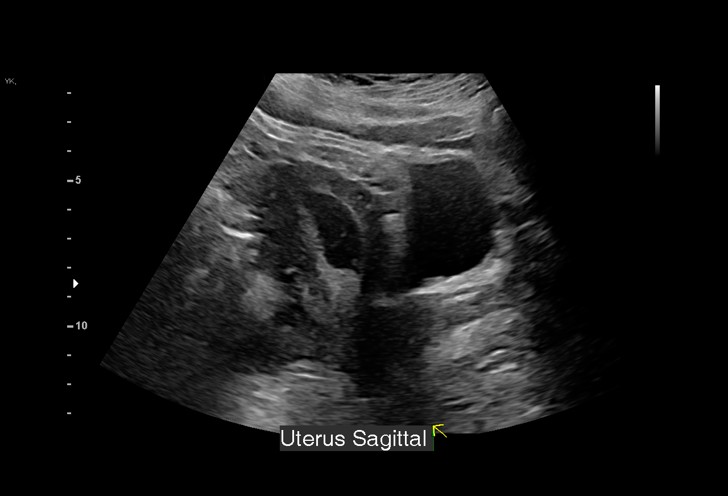
[im 4/44]
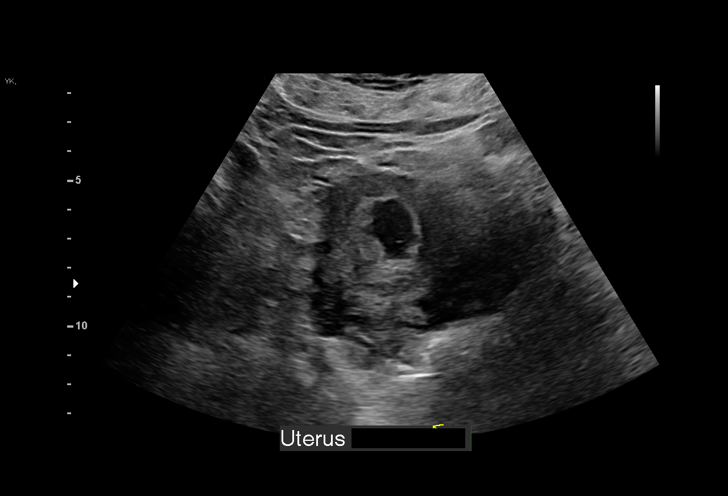
[im 7/44]
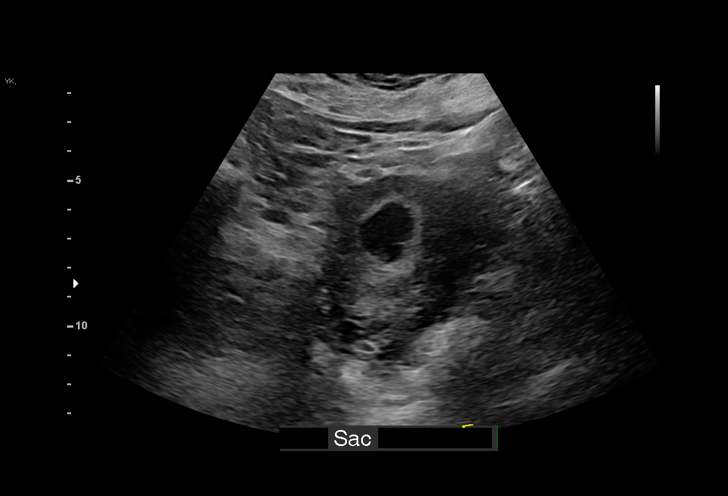
[im 10/44]
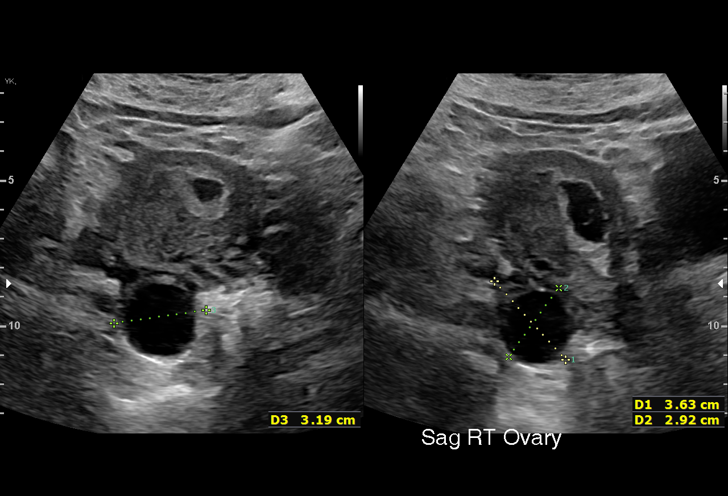
[im 13/44]
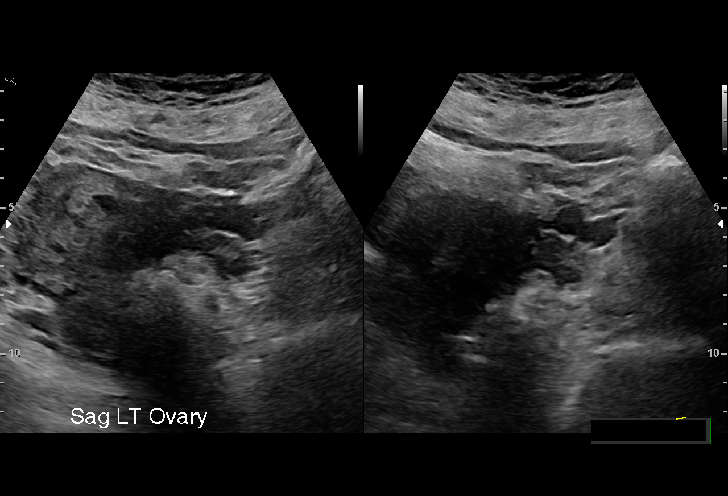
[im 16/44]
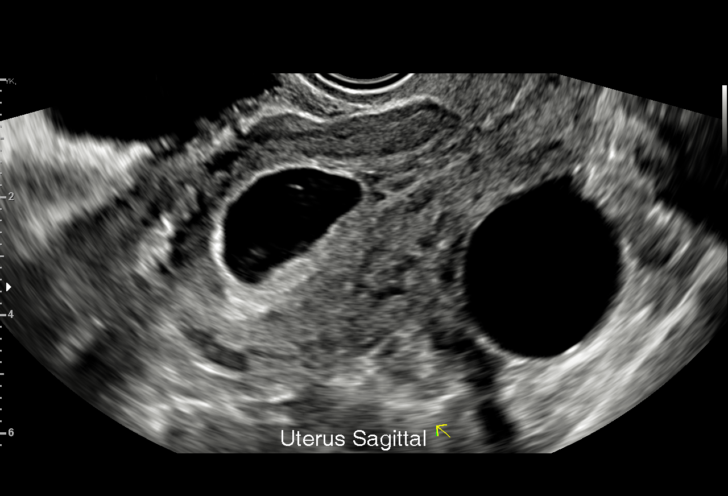
[im 20/44]
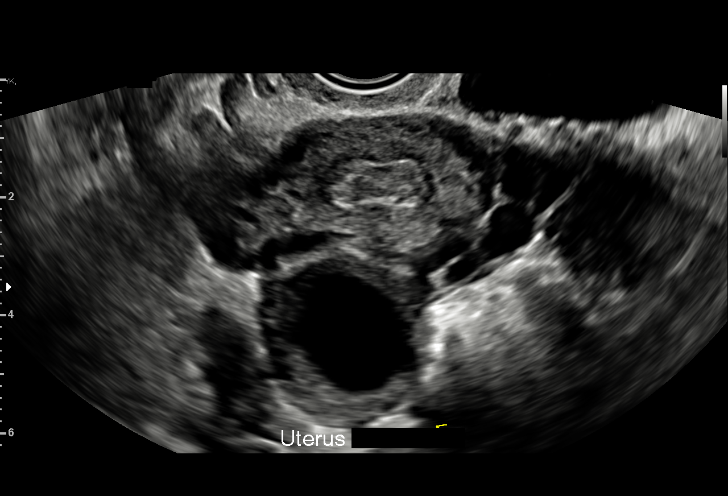
[im 23/44]
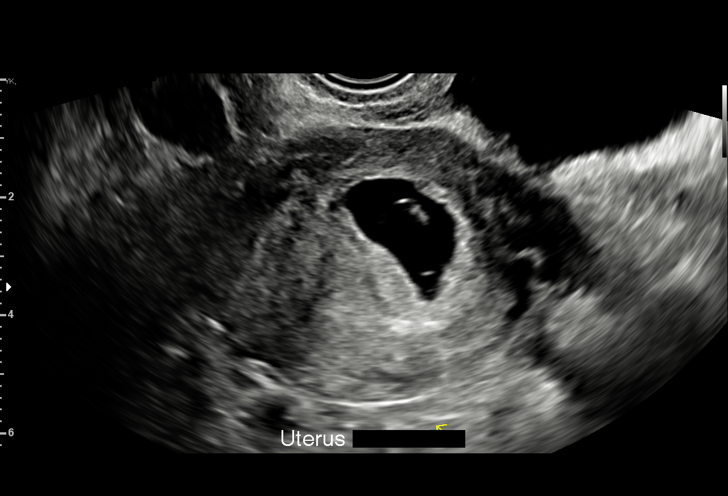
[im 24/44]
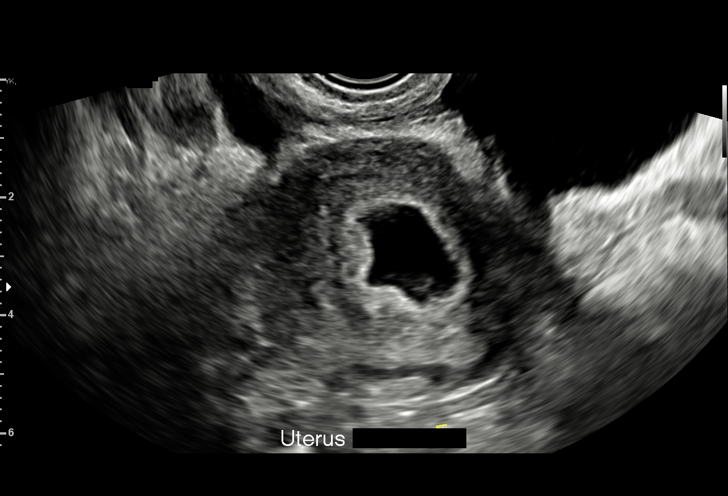
[im 28/44]
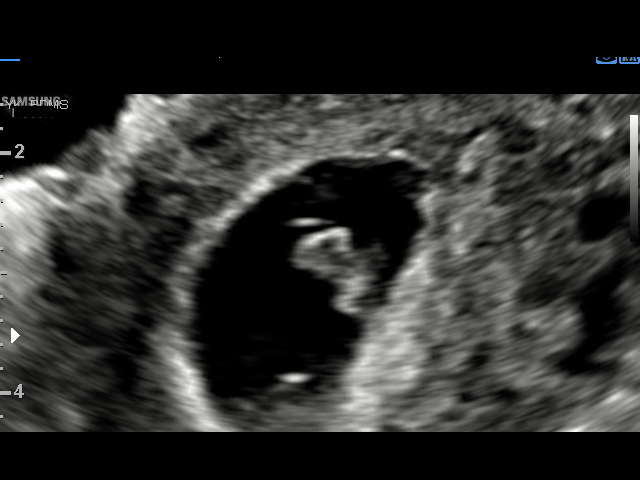
[im 31/44]
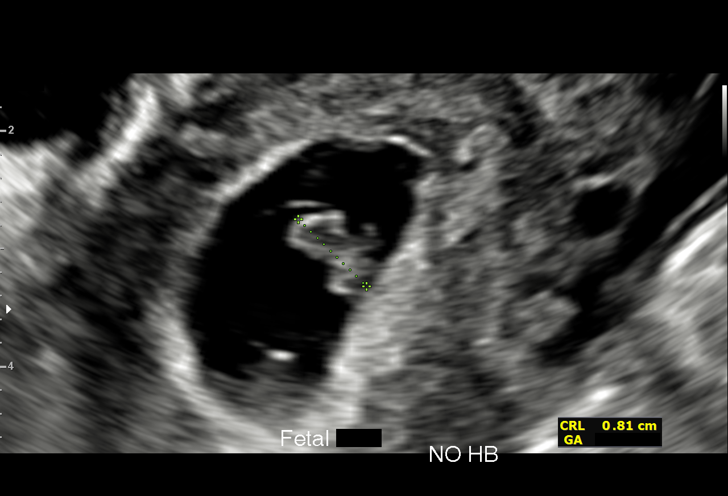
[im 34/44]
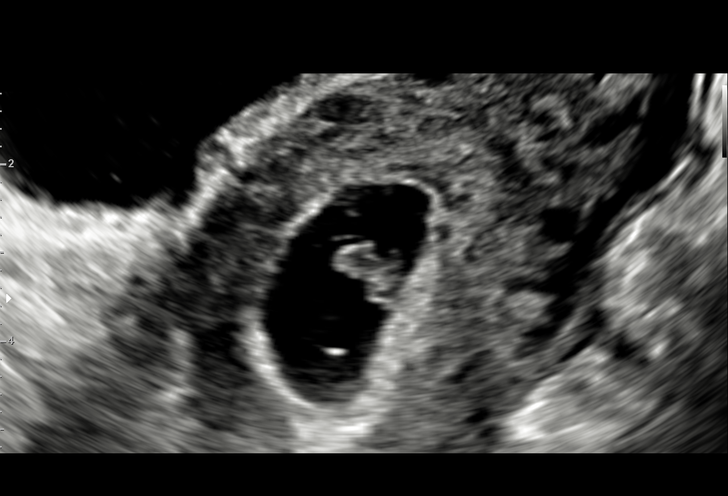
[im 37/44]
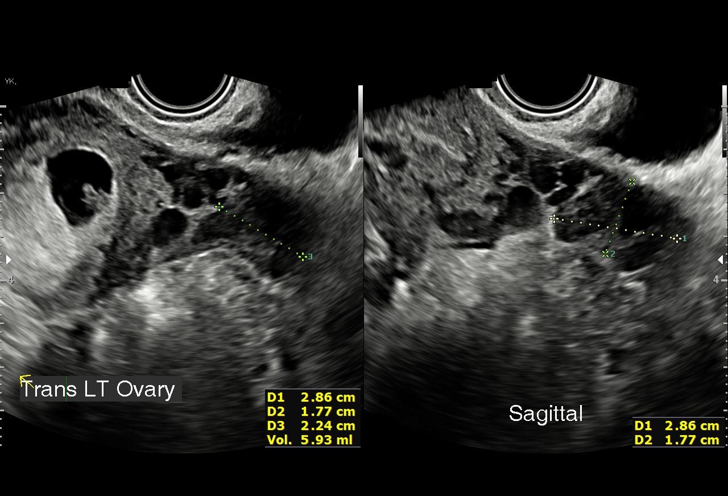
[im 40/44]
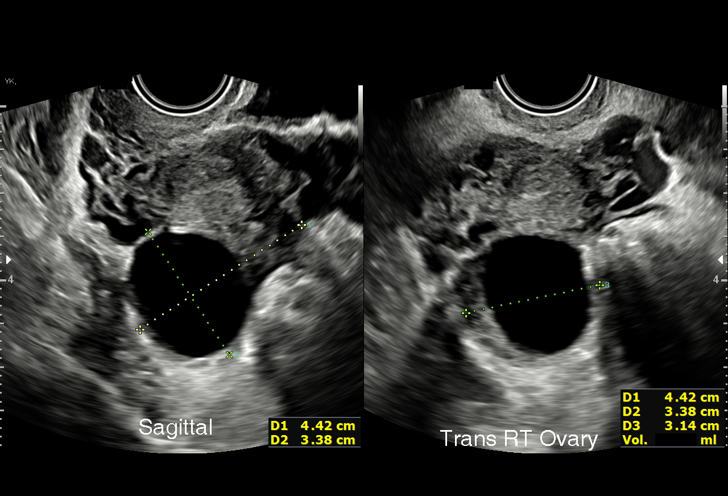
[im 44/44]
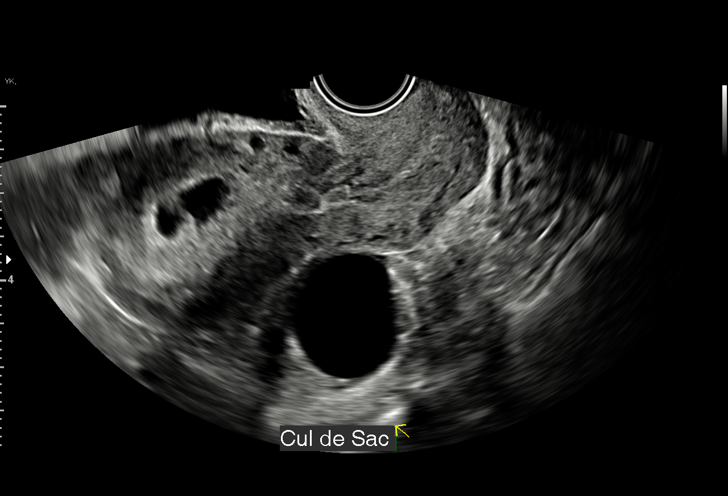

[15 of 28 positions shown; findings below may reference images not displayed]

FINDINGS: Intrauterine gestational sac: Single

Yolk sac:  Visualized.

Embryo:  Visualized.

Cardiac Activity: Not Visualized.

MSD:   mm    w     d

CRL:  7.3 mm mm   6 w   for d                  US EDC: January 25, 2022

Subchorionic hemorrhage:  None visualized.

Maternal uterus/adnexae: A corpus luteum cyst is seen in the right
ovary. The left ovary is normal.
IMPRESSION: 1. An IUP is identified. No cardiac activity is seen in the embryo
which demonstrates a crown-rump length of 7.3 mm. Findings meet
definitive criteria for failed pregnancy. This follows SRU consensus
guidelines: Diagnostic Criteria for Nonviable Pregnancy Early in the
First Trimester. N Engl J Med 3488;[DATE].

## 2023-04-19 ENCOUNTER — Ambulatory Visit: Payer: Self-pay | Admitting: Critical Care Medicine
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
## 2023-06-05 ENCOUNTER — Ambulatory Visit: Payer: Self-pay | Attending: Critical Care Medicine | Admitting: Critical Care Medicine

## 2023-06-05 ENCOUNTER — Telehealth: Payer: Self-pay | Admitting: Critical Care Medicine

## 2023-06-05 ENCOUNTER — Encounter: Payer: Self-pay | Admitting: Critical Care Medicine

## 2023-06-05 VITALS — BP 110/76 | HR 71 | Wt 196.2 lb

## 2023-06-05 DIAGNOSIS — R300 Dysuria: Secondary | ICD-10-CM

## 2023-06-05 DIAGNOSIS — F5102 Adjustment insomnia: Secondary | ICD-10-CM | POA: Insufficient documentation

## 2023-06-05 DIAGNOSIS — M62838 Other muscle spasm: Secondary | ICD-10-CM

## 2023-06-05 DIAGNOSIS — F329 Major depressive disorder, single episode, unspecified: Secondary | ICD-10-CM

## 2023-06-05 NOTE — Assessment & Plan Note (Signed)
As per insomnia

## 2023-06-05 NOTE — Assessment & Plan Note (Signed)
Obtain urinalysis

## 2023-06-05 NOTE — Assessment & Plan Note (Signed)
Patient given back exercises to use

## 2023-06-05 NOTE — Assessment & Plan Note (Addendum)
Likely from generalized anxiety disorder and mood disorder from external circumstances  Will refer to clinical social for behavioral health point Recommend taking melatonin 5 mg an hour before bed

## 2023-06-05 NOTE — Patient Instructions (Addendum)
Urine sample  Take melatonin 5mg  1 hour before bedtime Ms Caitlyn Clark will call you for behavioral therapy on your anxiety Return 6 months  muestra de orina  Tomar melatonina 5 mg 1 hora antes de [name]. [name] lo llamar para recibir terapia conductual sobre su ansiedad. Volver 6 meses
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
## 2023-06-06 NOTE — Progress Notes (Signed)
Let patient know urine test essentially normal no infection

## 2023-06-07 ENCOUNTER — Telehealth: Payer: Self-pay

## 2023-06-07 NOTE — Telephone Encounter (Signed)
-----   Message from Shan Levans sent at 06/06/2023 12:30 PM EDT ----- Let patient know urine test essentially normal no infection

## 2023-06-07 NOTE — Telephone Encounter (Addendum)
Pt was called and is aware of results, DOB was confirmed.  Interpreter: Byrd Hesselbach [id number]
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
## 2023-07-24 ENCOUNTER — Other Ambulatory Visit: Payer: Self-pay

## 2023-07-24 MED ORDER — AMOXICILLIN 500 MG PO CAPS
500.0000 mg | ORAL_CAPSULE | Freq: Three times a day (TID) | ORAL | 0 refills | Status: DC
  Filled 2023-07-24: qty 30, 10d supply, fill #0

## 2023-07-25 ENCOUNTER — Other Ambulatory Visit: Payer: Self-pay

## 2023-07-25 MED ORDER — CHLORHEXIDINE GLUCONATE 0.12 % MT SOLN
OROMUCOSAL | 0 refills | Status: DC
  Filled 2023-07-25: qty 473, 16d supply, fill #0

## 2023-07-25 MED ORDER — IBUPROFEN 400 MG PO TABS
400.0000 mg | ORAL_TABLET | ORAL | 0 refills | Status: DC | PRN
  Filled 2023-07-25: qty 30, 5d supply, fill #0

## 2023-07-25 MED ORDER — DEXAMETHASONE 4 MG PO TABS
4.0000 mg | ORAL_TABLET | Freq: Three times a day (TID) | ORAL | 0 refills | Status: DC | PRN
  Filled 2023-07-25: qty 9, 3d supply, fill #0

## 2023-07-25 MED ORDER — AMOXICILLIN 500 MG PO CAPS: 500.0000 mg | ORAL_CAPSULE | Freq: Three times a day (TID) | ORAL | 0 refills | Status: AC

## 2023-12-03 ENCOUNTER — Ambulatory Visit: Payer: Self-pay | Attending: Nurse Practitioner | Admitting: Nurse Practitioner

## 2023-12-03 ENCOUNTER — Encounter: Payer: Self-pay | Admitting: Nurse Practitioner

## 2023-12-03 VITALS — BP 107/71 | HR 66 | Resp 20 | Ht 63.25 in | Wt 200.6 lb

## 2023-12-03 DIAGNOSIS — R7303 Prediabetes: Secondary | ICD-10-CM

## 2023-12-03 DIAGNOSIS — Z7689 Persons encountering health services in other specified circumstances: Secondary | ICD-10-CM

## 2023-12-03 DIAGNOSIS — H538 Other visual disturbances: Secondary | ICD-10-CM

## 2023-12-03 NOTE — Progress Notes (Signed)
 Assessment & Plan:  Tierre was seen today for medical management of chronic issues.  Diagnoses and all orders for this visit:  Encounter to establish care  Prediabetes -     Hemoglobin A1c -     CMP14+EGFR  Blurred vision, bilateral -     Ambulatory referral to Ophthalmology    Patient has been counseled on age-appropriate routine health concerns for screening and prevention. These are reviewed and up-to-date. Referrals have been placed accordingly. Immunizations are up-to-date or declined.    Subjective:   Chief Complaint  Patient presents with   Medical Management of Chronic Issues    Caitlyn Clark 33 y.o. female presents to office today to establish care She is a previous patient of Dr. Delford Field  VRI was used to communicate directly with patient for the entire encounter including providing detailed patient instructions.    Notes last pap smear [facility].   She has a history of prediabetes.  Lab Results  Component Value Date   HGBA1C 5.8 (H) 01/31/2023     Notes blurred vision which started 3-4 years ago. Also vision is worse when driving at night.    Review of Systems  Constitutional:  Negative for fever, malaise/fatigue and weight loss.  HENT: Negative.  Negative for nosebleeds.   Eyes:  Positive for blurred vision. Negative for double vision and photophobia.  Respiratory: Negative.  Negative for cough and shortness of breath.   Cardiovascular: Negative.  Negative for chest pain, palpitations and leg swelling.  Gastrointestinal: Negative.  Negative for heartburn, nausea and vomiting.  Musculoskeletal: Negative.  Negative for myalgias.  Neurological: Negative.  Negative for dizziness, focal weakness, seizures and headaches.  Psychiatric/Behavioral: Negative.  Negative for suicidal ideas.     Past Medical History:  Diagnosis Date   Dysuria 07/18/2021   Medical history non-contributory    No pertinent past medical  history     Past Surgical History:  Procedure Laterality Date   CESAREAN SECTION      Family History  Problem Relation Age of Onset   Diabetes Father    Anesthesia problems Neg Hx     Social History Reviewed with no changes to be made today.   Outpatient Medications Prior to Visit  Medication Sig Dispense Refill   chlorhexidine (PERIDEX) 0.12 % solution Swish for 30 seconds then spit, use twice daily. (Patient not taking: Reported on 12/03/2023) 473 mL 0   dexamethasone (DECADRON) 4 MG tablet Take 1 tablet (4 mg total) by mouth 3 (three) times daily as needed for swelling. (Patient not taking: Reported on 12/03/2023) 9 tablet 0   ibuprofen (ADVIL) 400 MG tablet Take 1 tablet (400 mg total) by mouth every 4 (four) hours as needed for pain. (Patient not taking: Reported on 12/03/2023) 30 tablet 0   No facility-administered medications prior to visit.    No Known Allergies     Objective:    BP 107/71 (BP Location: Left Arm, Patient Position: Sitting)   Pulse 66   Resp 20   Ht 5' 3.25" (1.607 m)   Wt 200 lb 9.6 oz (91 kg)   LMP 02/19/2023 (Approximate)   SpO2 100%   Breastfeeding No   BMI 35.25 kg/m  Wt Readings from Last 3 Encounters:  12/03/23 200 lb 9.6 oz (91 kg)  06/05/23 196 lb 3.2 oz (89 kg)  01/30/23 193 lb 9.6 oz (87.8 kg)    Physical Exam Vitals and nursing note reviewed.  Constitutional:  Appearance: She is well-developed.  HENT:     Head: Normocephalic and atraumatic.  Cardiovascular:     Rate and Rhythm: Normal rate and regular rhythm.     Heart sounds: Normal heart sounds. No murmur heard.    No friction rub. No gallop.  Pulmonary:     Effort: Pulmonary effort is normal. No tachypnea or respiratory distress.     Breath sounds: Normal breath sounds. No decreased breath sounds, wheezing, rhonchi or rales.  Chest:     Chest wall: No tenderness.  Abdominal:     General: Bowel sounds are normal.     Palpations: Abdomen is soft.  Musculoskeletal:         General: Normal range of motion.     Cervical back: Normal range of motion.  Skin:    General: Skin is warm and dry.  Neurological:     Mental Status: She is alert and oriented to person, place, and time.     Coordination: Coordination normal.  Psychiatric:        Behavior: Behavior normal. Behavior is cooperative.        Thought Content: Thought content normal.        Judgment: Judgment normal.          Patient has been counseled extensively about nutrition and exercise as well as the importance of adherence with medications and regular follow-up. The patient was given clear instructions to go to ER or return to medical center if symptoms don't improve, worsen or new problems develop. The patient verbalized understanding.   Follow-up: Return if symptoms worsen or fail to improve.   Caitlyn Rigg, FNP-BC [facility] [place], [place] [phone]   12/03/2023, 9:23 AM

## 2023-12-04 LAB — CMP14+EGFR
ALT: 112 IU/L — ABNORMAL HIGH (ref 0–32)
AST: 66 IU/L — ABNORMAL HIGH (ref 0–40)
Albumin: 4.4 g/dL (ref 3.9–4.9)
Alkaline Phosphatase: 119 IU/L (ref 44–121)
BUN/Creatinine Ratio: 19 (ref 9–23)
BUN: 12 mg/dL (ref 6–20)
Bilirubin Total: 0.2 mg/dL (ref 0.0–1.2)
CO2: 21 mmol/L (ref 20–29)
Calcium: 9.3 mg/dL (ref 8.7–10.2)
Chloride: 103 mmol/L (ref 96–106)
Creatinine, Ser: 0.63 mg/dL (ref 0.57–1.00)
Globulin, Total: 2.8 g/dL (ref 1.5–4.5)
Glucose: 112 mg/dL — ABNORMAL HIGH (ref 70–99)
Potassium: 4.4 mmol/L (ref 3.5–5.2)
Sodium: 138 mmol/L (ref 134–144)
Total Protein: 7.2 g/dL (ref 6.0–8.5)
eGFR: 120 mL/min/{1.73_m2} (ref >59)

## 2023-12-04 LAB — HEMOGLOBIN A1C
Est. average glucose Bld gHb Est-mCnc: 126 mg/dL
Hgb A1c MFr Bld: 6 % — ABNORMAL HIGH (ref 4.8–5.6)
